# Patient Record
Sex: Male | Born: 1980 | Race: White | Hispanic: No | Marital: Married | Smoking: Never smoker
Health system: Southern US, Community
[De-identification: ages and names within clinical notes are randomized; demographics above are authoritative.]

## PROBLEM LIST (undated history)

## (undated) DIAGNOSIS — B019 Varicella without complication: Secondary | ICD-10-CM

## (undated) DIAGNOSIS — D179 Benign lipomatous neoplasm, unspecified: Secondary | ICD-10-CM

## (undated) DIAGNOSIS — M7051 Other bursitis of knee, right knee: Secondary | ICD-10-CM

## (undated) DIAGNOSIS — E785 Hyperlipidemia, unspecified: Secondary | ICD-10-CM

## (undated) HISTORY — DX: Varicella without complication: B01.9

## (undated) HISTORY — DX: Benign lipomatous neoplasm, unspecified: D17.9

## (undated) HISTORY — DX: Hyperlipidemia, unspecified: E78.5

## (undated) HISTORY — DX: Other bursitis of knee, right knee: M70.51

---
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
## 1998-04-04 ENCOUNTER — Inpatient Hospital Stay: Admission: RE | Admit: 1998-04-04 | Discharge: 1998-04-09 | Payer: Self-pay | Admitting: Cardiothoracic Surgery

## 1998-04-04 ENCOUNTER — Encounter: Payer: Self-pay | Admitting: Cardiothoracic Surgery

## 1998-04-05 ENCOUNTER — Encounter: Payer: Self-pay | Admitting: Cardiothoracic Surgery

## 1998-04-06 ENCOUNTER — Encounter: Payer: Self-pay | Admitting: Cardiothoracic Surgery

## 1998-04-07 ENCOUNTER — Encounter: Payer: Self-pay | Admitting: Cardiothoracic Surgery

## 2008-03-23 ENCOUNTER — Encounter: Admission: RE | Admit: 2008-03-23 | Discharge: 2008-03-23 | Payer: Self-pay | Admitting: Family Medicine

## 2009-10-31 IMAGING — US US EXTREM LOW VENOUS*R*
1 series · 14 of 24 positions shown · non-contrast
Comparison: None

CLINICAL DATA: Right calf pain



[Series 1: us extrem low venous*right* · 14 of 26 slices shown]
[im 1/26]
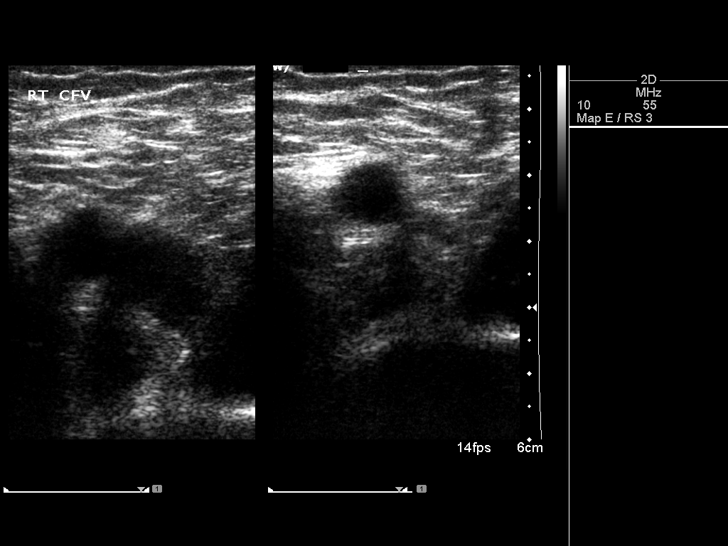
[im 3/26]
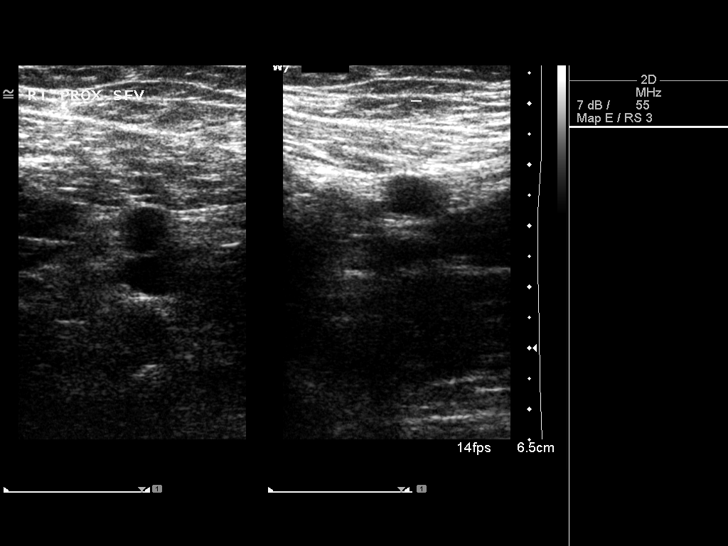
[im 5/26]
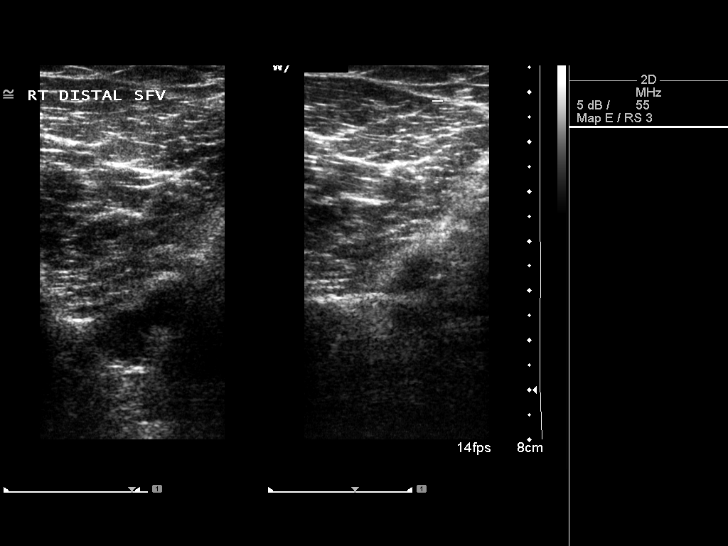
[im 7/26]
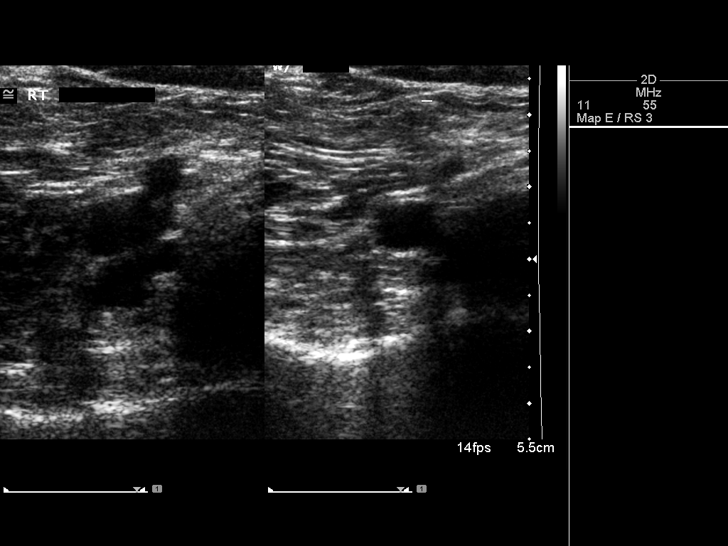
[im 8/26]
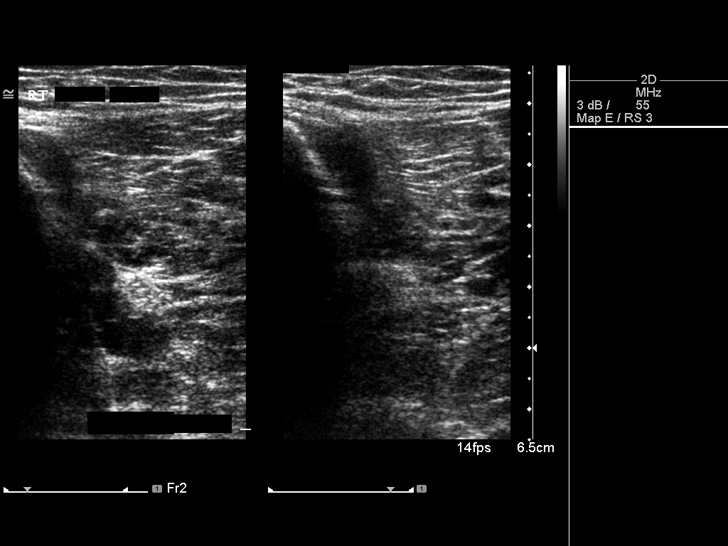
[im 10/26]
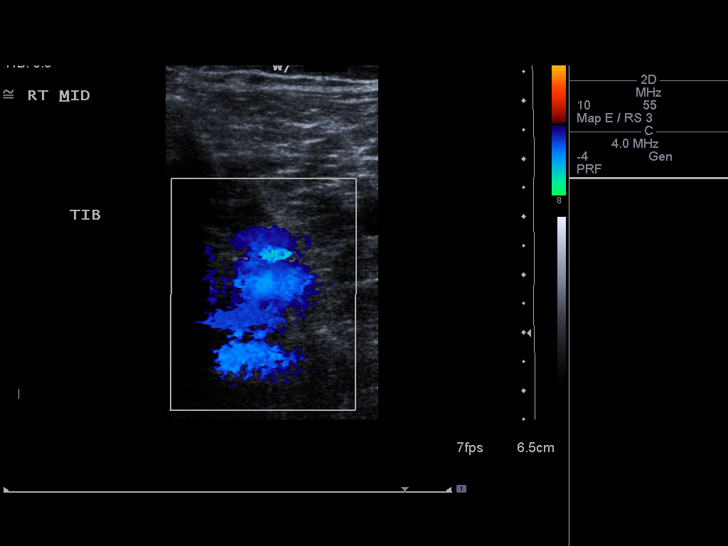
[im 12/26]
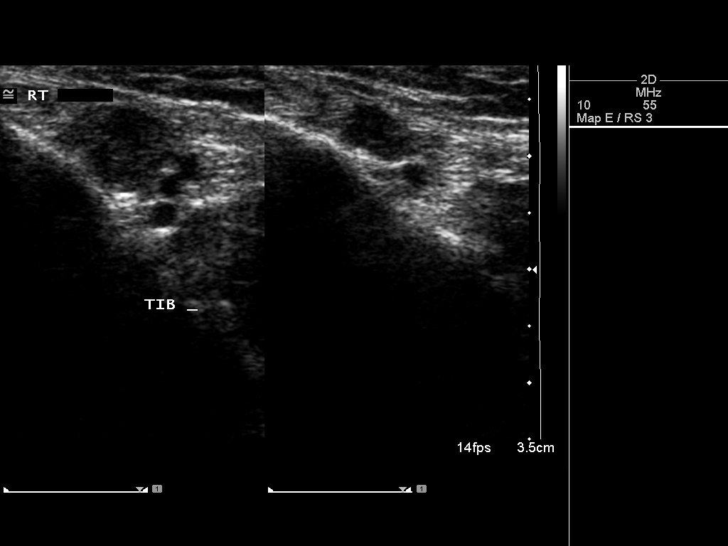
[im 14/26]
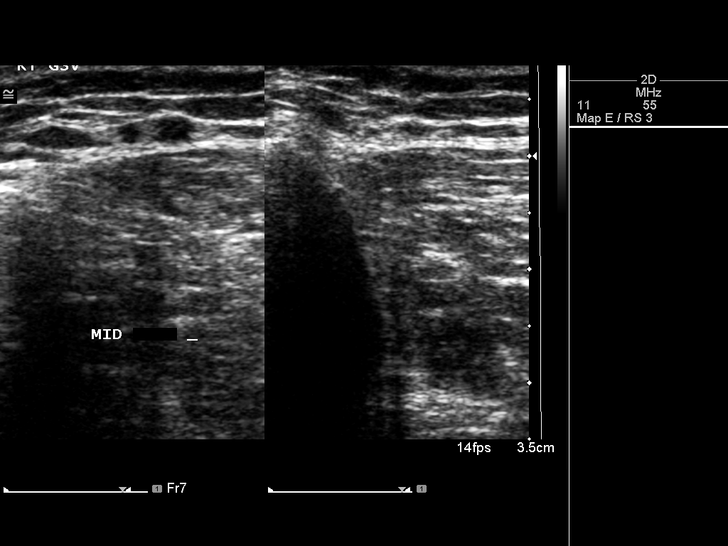
[im 16/26]
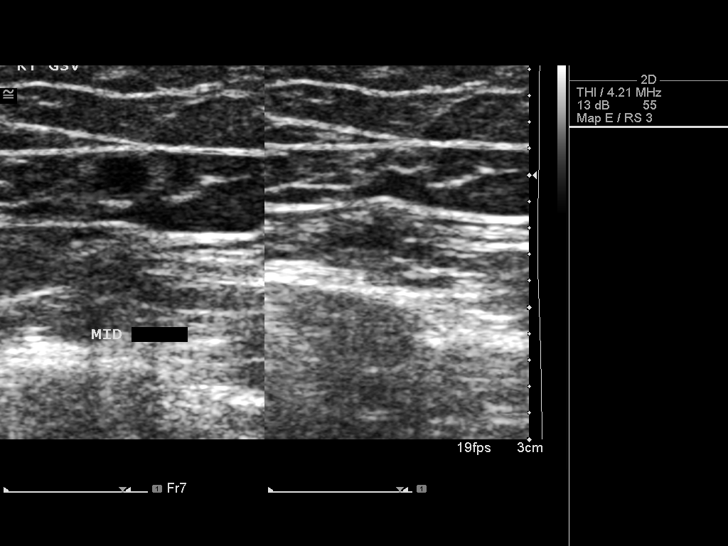
[im 18/26]
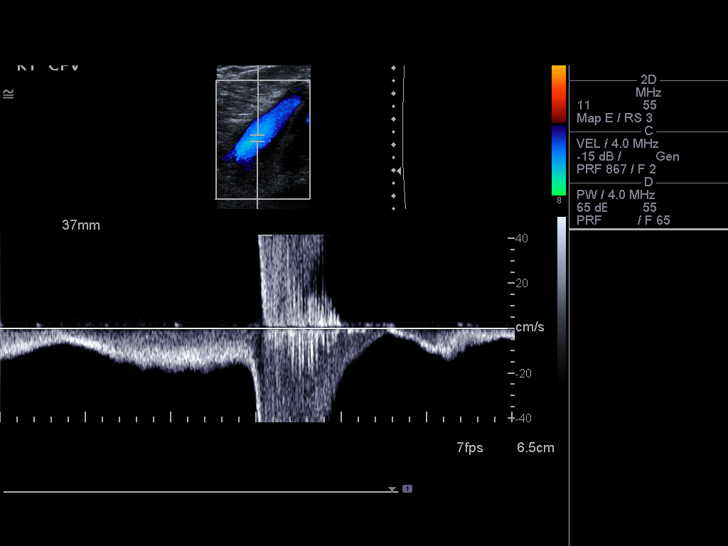
[im 20/26]
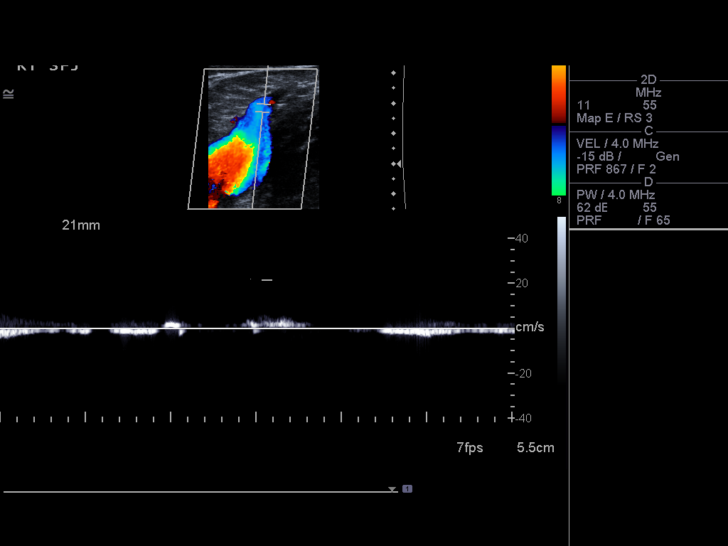
[im 21/26]
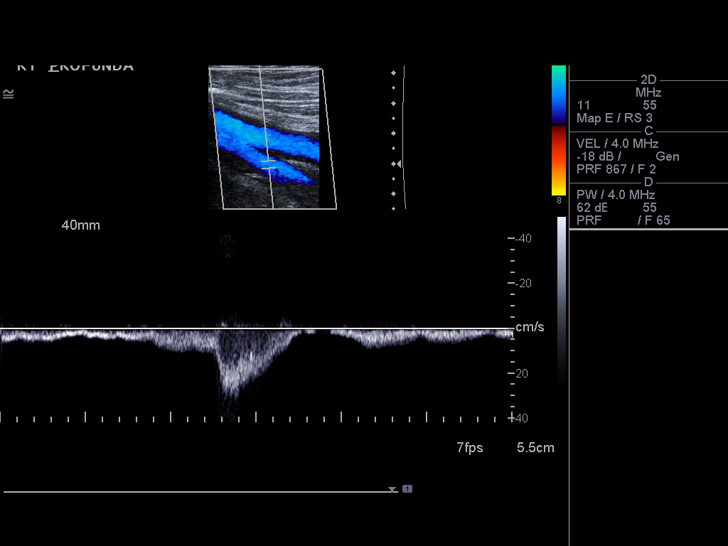
[im 23/26]
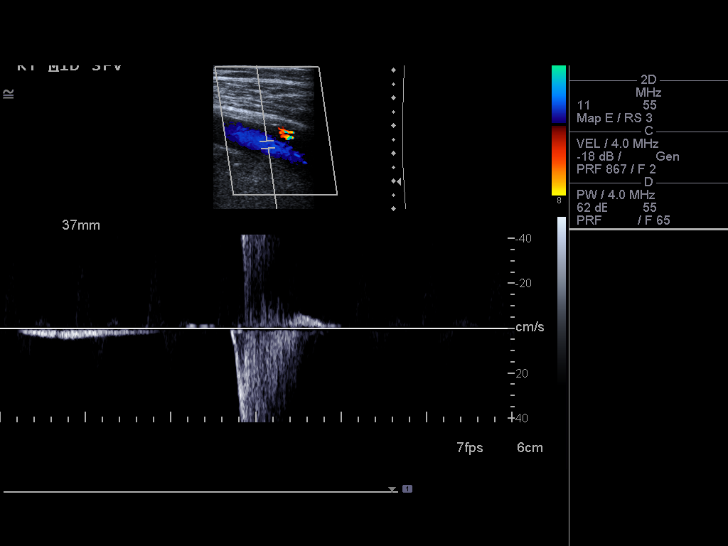
[im 26/26]
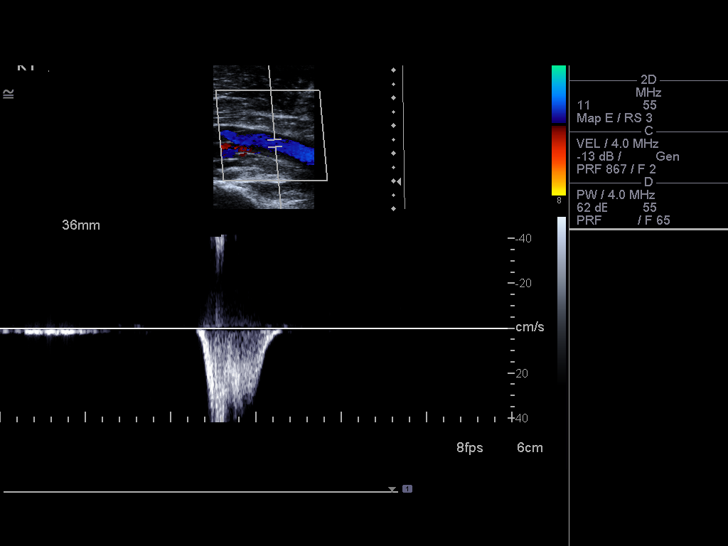

[14 of 24 positions shown; findings below may reference images not displayed]

FINDINGS: Normal compressibility of  the right common femoral,
superficial femoral, and popliteal veins is demonstrated, as well
as the visualized proximal calf veins.  No filling defects to
suggest DVT on grayscale or color Doppler imaging.  Doppler
waveforms show normal direction of venous flow, normal respiratory
phasicity and response to augmentation.
IMPRESSION: No evidence of  right lower extremity deep vein thrombosis.

## 2012-03-10 ENCOUNTER — Ambulatory Visit
Admission: RE | Admit: 2012-03-10 | Discharge: 2012-03-10 | Disposition: A | Discharge status: Home / Self Care | Payer: No Typology Code available for payment source | Source: Ambulatory Visit | Attending: Occupational Medicine | Admitting: Occupational Medicine

## 2012-03-10 ENCOUNTER — Other Ambulatory Visit: Payer: Self-pay | Admitting: Occupational Medicine

## 2012-03-10 DIAGNOSIS — Z021 Encounter for pre-employment examination: Secondary | ICD-10-CM

## 2012-08-10 ENCOUNTER — Ambulatory Visit (INDEPENDENT_AMBULATORY_CARE_PROVIDER_SITE_OTHER): Payer: 59 | Admitting: Surgery

## 2012-08-10 ENCOUNTER — Encounter (INDEPENDENT_AMBULATORY_CARE_PROVIDER_SITE_OTHER): Payer: Self-pay | Admitting: Surgery

## 2012-08-10 VITALS — BP 122/78 | HR 72 | Temp 98.2°F | Resp 16 | Ht 74.0 in | Wt 219.8 lb

## 2012-08-10 DIAGNOSIS — R222 Localized swelling, mass and lump, trunk: Secondary | ICD-10-CM

## 2012-08-10 DIAGNOSIS — R229 Localized swelling, mass and lump, unspecified: Secondary | ICD-10-CM

## 2012-08-10 NOTE — Progress Notes (Signed)
Subjective:     Patient ID: Brendan Brady, male   DOB: [birth year], 32 y.o.   MRN: [id number]  HPI This is a pleasant gentleman who is a self-referral for evaluation of a mass on his left upper back. He noticed it about a month ago. He is in Geographical information systems officer and is having discomfort at the area of the mass. He is otherwise without complaints.  Review of Systems He denies chest pain or shortness of breath. He has no neurologic symptoms and no pain down his arm    Objective:   Physical Exam He is awake and alert and normal in appearance in no acute distress Lungs clear to auscultation bilaterally with normal respiratory rate Cardiovascular is regular rate and rhythm with no murmurs and no peripheral edema There is a 2 cm mass just to the left of the midline of the upper back at the base of the neck. There are no skin changes. 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 Because of a previous history of a thoracic mass and the fact this is symptomatic, removal is recommended. He will schedule this to be done in the procedure room in our office at his convenience.

## 2014-04-12 ENCOUNTER — Other Ambulatory Visit (INDEPENDENT_AMBULATORY_CARE_PROVIDER_SITE_OTHER): Payer: Self-pay | Admitting: Surgery

## 2014-05-18 ENCOUNTER — Ambulatory Visit (INDEPENDENT_AMBULATORY_CARE_PROVIDER_SITE_OTHER): Payer: 59 | Admitting: Family Medicine

## 2014-05-18 ENCOUNTER — Encounter: Payer: Self-pay | Admitting: Family Medicine

## 2014-05-18 VITALS — BP 130/80 | HR 90 | Temp 98.0°F | Ht 74.0 in | Wt 240.0 lb

## 2014-05-18 DIAGNOSIS — Z23 Encounter for immunization: Secondary | ICD-10-CM

## 2014-05-18 DIAGNOSIS — N50812 Left testicular pain: Secondary | ICD-10-CM

## 2014-05-18 DIAGNOSIS — N508 Other specified disorders of male genital organs: Secondary | ICD-10-CM

## 2014-05-18 NOTE — Progress Notes (Signed)
   Subjective:    Patient ID: Brendan Brady, male    DOB: 1980-11-19, 33 y.o.   MRN: [id number]  HPI  Patient here to establish care. Generally very healthy. He had lipoma excised from his back recently by surgeon that is healed well. No medications. No surgeries otherwise. He is married and has 2 children. One-year-old with cystic fibrosis. Patient has never smoked. No alcohol use. No history of illicit drug use.  He works as a Airline pilot. Family history is significant for mother with breast cancer.  Patient is seen with acute issue of onset 1 week ago of left testicle pain. No injury. No masses palpated. No urinary symptoms. He took some ibuprofen which helped slightly. Symptoms are actually somewhat better compared to last week and only mild pain at this time. No exacerbating features.  Past Medical History  Diagnosis Date  . Chicken pox   . Lipoma    Past Surgical History  Procedure Laterality Date  . Cyst excision  1998    reports that he has never smoked. He has never used smokeless tobacco. He reports that he drinks alcohol. He reports that he does not use illicit drugs. family history includes Arthritis in his father; Cancer in his mother. Allergies  Allergen Reactions  . Latex       Review of Systems  Constitutional: Negative for fever, chills and appetite change.  Genitourinary: Negative for dysuria and hematuria.  Hematological: Negative for adenopathy. Does not bruise/bleed easily.       Objective:   Physical Exam  Constitutional: He appears well-developed and well-nourished.  Cardiovascular: Normal rate.   Pulmonary/Chest: Effort normal and breath sounds normal. No respiratory distress. He has no wheezes. He has no rales.  Genitourinary:  Testes are normal. No epididymis pain. No hernia. No testicle mass.          Assessment & Plan:  Left testicular pain which is actually improving. Normal exam. ?noninfectious epididymitis vs other.  We recommend continue  ibuprofen and briefs for support. Touch base in 1-2 weeks if not fully resolved.

## 2014-05-18 NOTE — Patient Instructions (Signed)
Continue with Ibuprofen as needed Continue with briefs for support. Please let me know in 1-2 weeks if pain not continuing to improve.

## 2014-05-18 NOTE — Progress Notes (Signed)
Pre visit review using our clinic review tool, if applicable. No additional management support is needed unless otherwise documented below in the visit note. 

## 2014-06-27 ENCOUNTER — Encounter: Payer: Self-pay | Admitting: Family Medicine

## 2014-06-27 ENCOUNTER — Ambulatory Visit (INDEPENDENT_AMBULATORY_CARE_PROVIDER_SITE_OTHER): Payer: 59 | Admitting: Family Medicine

## 2014-06-27 VITALS — BP 120/70 | HR 88 | Temp 97.9°F | Wt 243.0 lb

## 2014-06-27 DIAGNOSIS — E781 Pure hyperglyceridemia: Secondary | ICD-10-CM

## 2014-06-27 DIAGNOSIS — R740 Nonspecific elevation of levels of transaminase and lactic acid dehydrogenase [LDH]: Secondary | ICD-10-CM

## 2014-06-27 DIAGNOSIS — R7401 Elevation of levels of liver transaminase levels: Secondary | ICD-10-CM

## 2014-06-27 NOTE — Progress Notes (Signed)
Pre visit review using our clinic review tool, if applicable. No additional management support is needed unless otherwise documented below in the visit note. 

## 2014-06-27 NOTE — Patient Instructions (Signed)

## 2014-06-27 NOTE — Progress Notes (Signed)
   Subjective:    Patient ID: Brendan Brady, male    DOB: October 15, 1980, 33 y.o.   MRN: [id number]  HPI Patient seen for evaluation regarding abnormal labs recently obtained through his work. He works as a Airline pilot. His labs were significant for mildly elevated liver transaminases with AST of 46 and ALT of 86. He also had triglycerides of 198 and low HDL 33. Blood sugar was normal. Alkaline phosphatase normal. No alcohol use. No history of blood transfusions. No prior history of known elevated liver transaminases. He has gained substantial weight over the past couple of years. He stays physically active but frequently knows that he overeats.  Denies any appetite or weight changes. No abdominal pain. No history of polyuria or polydipsia. No history of hepatitis. He is monogamous. No history of IV drug use. Takes no regular medications. He did take some hydrocodone recent weeks after having lipoma excised per surgeon. He takes occasional ibuprofen but no other medications  Past Medical History  Diagnosis Date  . Chicken pox   . Lipoma    Past Surgical History  Procedure Laterality Date  . Cyst excision  1998    reports that he has never smoked. He has never used smokeless tobacco. He reports that he drinks alcohol. He reports that he does not use illicit drugs. family history includes Arthritis in his father; Cancer in his mother. Allergies  Allergen Reactions  . Latex       Review of Systems  Constitutional: Negative for fever, chills, appetite change and unexpected weight change.  Respiratory: Negative for shortness of breath.   Gastrointestinal: Negative for nausea, vomiting, abdominal pain, diarrhea and constipation.  Hematological: Negative for adenopathy.       Objective:   Physical Exam  Constitutional: He appears well-developed and well-nourished.  Cardiovascular: Normal rate and regular rhythm.   Pulmonary/Chest: Effort normal and breath sounds normal. No respiratory  distress. He has no wheezes. He has no rales.  Abdominal: Soft. Bowel sounds are normal. He exhibits no distension and no mass. There is no tenderness. There is no rebound and no guarding.          Assessment & Plan:  #1 elevated liver transaminases. We explained these are very nonspecific. He is basically asymptomatic. He's had substantial weight gain past year and suspect fatty liver changes are most likely culprit. Since he is asymptomatic we gave option of losing some weight and recheck liver transaminases in about 2 months. Consider further evaluation then if either elevating or not coming down #2 elevated triglycerides. He also has low HDL. At risk for metabolic syndrome. Handout given on lowering triglycerides. We've recommended lifestyle modification as a first step. Consider repeat fasting lipids within the next year.

## 2014-07-05 ENCOUNTER — Encounter: Payer: Self-pay | Admitting: Occupational Medicine

## 2014-08-23 ENCOUNTER — Other Ambulatory Visit: Payer: 59

## 2014-09-21 ENCOUNTER — Other Ambulatory Visit: Payer: Self-pay

## 2015-06-19 ENCOUNTER — Ambulatory Visit (INDEPENDENT_AMBULATORY_CARE_PROVIDER_SITE_OTHER): Payer: 59 | Admitting: Family Medicine

## 2015-06-19 ENCOUNTER — Encounter: Payer: Self-pay | Admitting: Family Medicine

## 2015-06-19 VITALS — BP 120/74 | HR 97 | Temp 98.6°F | Resp 16 | Ht 74.0 in | Wt 229.1 lb

## 2015-06-19 DIAGNOSIS — B349 Viral infection, unspecified: Secondary | ICD-10-CM | POA: Diagnosis not present

## 2015-06-19 NOTE — Progress Notes (Signed)
   Subjective:    Patient ID: Brendan Brady, male    DOB: 11/29/1980, 34 y.o.   MRN: [id number]  HPI Acute visit Onset last night of chills, possible low-grade fever, cough, nasal congestion. Went to bed early and had significant night sweats andwoke up this morning feeling much better. Denies any sore throat or rash. Congestion is moderate. Cough is relatively mild. No dyspnea. Denies any nausea, vomiting, or diarrhea.  Past Medical History  Diagnosis Date  . Chicken pox   . Lipoma    Past Surgical History  Procedure Laterality Date  . Cyst excision  1998    reports that he has never smoked. He has never used smokeless tobacco. He reports that he drinks alcohol. He reports that he does not use illicit drugs. family history includes Arthritis in his father; Cancer in his mother. Allergies  Allergen Reactions  . Latex       Review of Systems  Constitutional: Positive for fever, chills and fatigue.  HENT: Positive for congestion.   Respiratory: Positive for cough.   Gastrointestinal: Negative for nausea, vomiting and abdominal pain.  Genitourinary: Negative for dysuria.  Skin: Negative for rash.       Objective:   Physical Exam  Constitutional: He appears well-developed and well-nourished. No distress.  HENT:  Right Ear: External ear normal.  Left Ear: External ear normal.  Mouth/Throat: Oropharynx is clear and moist.  Neck: Neck supple.  Cardiovascular: Normal rate and regular rhythm.   Pulmonary/Chest: Effort normal and breath sounds normal. No respiratory distress. He has no wheezes. He has no rales.  Lymphadenopathy:    He has no cervical adenopathy.  Skin: No rash noted.          Assessment & Plan:  Viral syndrome. Patient already improved symptomatically. Increase hydration. Treat fever and myalgias symptomatically with Advil or Aleve. Follow-up as needed

## 2015-06-19 NOTE — Patient Instructions (Signed)
Viral Infections °A viral infection can be caused by different types of viruses. Most viral infections are not serious and resolve on their own. However, some infections may cause severe symptoms and may lead to further complications. °SYMPTOMS °Viruses can frequently cause: °· Minor sore throat. °· Aches and pains. °· Headaches. °· Runny nose. °· Different types of rashes. °· Watery eyes. °· Tiredness. °· Cough. °· Loss of appetite. °· Gastrointestinal infections, resulting in nausea, vomiting, and diarrhea. °These symptoms do not respond to antibiotics because the infection is not caused by bacteria. However, you might catch a bacterial infection following the viral infection. This is sometimes called a "superinfection." Symptoms of such a bacterial infection may include: °· Worsening sore throat with pus and difficulty swallowing. °· Swollen neck glands. °· Chills and a high or persistent fever. °· Severe headache. °· Tenderness over the sinuses. °· Persistent overall ill feeling (malaise), muscle aches, and tiredness (fatigue). °· Persistent cough. °· Yellow, green, or brown mucus production with coughing. °HOME CARE INSTRUCTIONS  °· Only take over-the-counter or prescription medicines for pain, discomfort, diarrhea, or fever as directed by your caregiver. °· Drink enough water and fluids to keep your urine clear or pale yellow. Sports drinks can provide valuable electrolytes, sugars, and hydration. °· Get plenty of rest and maintain proper nutrition. Soups and broths with crackers or rice are fine. °SEEK IMMEDIATE MEDICAL CARE IF:  °· You have severe headaches, shortness of breath, chest pain, neck pain, or an unusual rash. °· You have uncontrolled vomiting, diarrhea, or you are unable to keep down fluids. °· You or your child has an oral temperature above 102° F (38.9° C), not controlled by medicine. °· Your baby is older than 3 months with a rectal temperature of 102° F (38.9° C) or higher. °· Your baby is 3  months old or younger with a rectal temperature of 100.4° F (38° C) or higher. °MAKE SURE YOU:  °· Understand these instructions. °· Will watch your condition. °· Will get help right away if you are not doing well or get worse. °  °This information is not intended to replace advice given to you by your health care provider. Make sure you discuss any questions you have with your health care provider. °  °Document Released: 04/17/2005 Document Revised: 09/30/2011 Document Reviewed: 12/14/2014 °Elsevier Interactive Patient Education ©2016 [employer] ° °

## 2015-06-19 NOTE — Progress Notes (Signed)
Pre visit review using our clinic review tool, if applicable. No additional management support is needed unless otherwise documented below in the visit note. 

## 2015-06-20 ENCOUNTER — Telehealth: Payer: Self-pay | Admitting: Family Medicine

## 2015-06-20 NOTE — Telephone Encounter (Signed)
Elmyra Ricks, Mr. Fosters wife called saying Dr. B told her if he got worse to call back and he's gotten worse. She said he's currently on the couch shivering and they're not sure what they need to do. Please give them a phone call regarding this.  Nicole's ph# [phone] Thank you.

## 2015-06-20 NOTE — Telephone Encounter (Signed)
[facility] [place] [facility]  Patient Name: Brendan Brady  DOB: [birth year]    Initial Comment caller states her husband was in the ofc yesterday - was told to call today if he is not better - has been shivering for 5 hrs - has temp of only 99.6 and has nausea and vomiting   Nurse Assessment  Nurse: Wynetta Emery, RN, Baker Janus Date/Time [name] Time): 06/20/2015 5:23:09 PM  Confirm and document reason for call. If symptomatic, describe symptoms. ---[name] was seen yesterday in office -- extreme fatigue achy fever but not registering -- shivering -- advised by MD to call back if things changed or got worse; attempted to go to work today and came home early and crashed on the couch -99.6 low grade shivering still since 12n today and fatigued achy and now vomiting  Has the patient traveled out of the country within the last 30 days? ---No  Does the patient have any new or worsening symptoms? ---Yes  Will a triage be completed? ---Yes  Related visit to physician within the last 2 weeks? ---No  Does the PT have any chronic conditions? (i.e. diabetes, asthma, etc.) ---No  Is this a behavioral health call? ---No     Guidelines    Guideline Title Affirmed Question Affirmed Notes  Headache [1] SEVERE headache AND [2] fever    Final Disposition User   Go to ED Now (or PCP triage) Wynetta Emery, RN, Baker Janus    Comments  Dr. Nicki Reaper called back gave her information; she tried to call them and was unable to reach Her. called on both numbers Please advise she did attempt to call them if they call back. Nurse called back and Elmyra Ricks had been speaking with Dr. Elease Hashimoto and given Medical Directives of what to do Thanked Nurse for calling back and advising that Dr. Nicki Reaper did call or attempt to call.   Referrals  REFERRED TO PCP OFFICE  REFERRED TO PCP OFFICE   Disagree/Comply: Comply

## 2015-06-20 NOTE — Telephone Encounter (Signed)
Please review

## 2015-06-21 NOTE — Telephone Encounter (Signed)
No nausea or fever

## 2015-06-21 NOTE — Telephone Encounter (Signed)
Spoke with wife this AM, there is some improvement in Symptoms. He was able to keep down his breakfast and fluids. Last PM was the last time he vomited. His fever was 103 last night but today is 97.1. I recommended for pt to rest and eat a bland diet today. He will try to increase fluids. If fever returns or sx worsen again, she will notify the office. Any other recommendations?

## 2015-06-21 NOTE — Telephone Encounter (Signed)
I spoke with his wife last night.  He had no fever at that time but did have chills and had one episode of vomiting yesterday.  Denied abdominal pain, dysuria, confusion, Advised follow up for any persistent vomiting or persistent or worsening symptoms.  He is keeping fluids down.   I would call again this morning to see how he is doing.

## 2015-06-21 NOTE — Telephone Encounter (Signed)
I think that sounds good.

## 2015-06-21 NOTE — Telephone Encounter (Signed)
Spoke to pt and he said he doing a little better but still feeling sick. Looking forward to see if Dr Elease Hashimoto send any medication to the pharmacy

## 2015-06-21 NOTE — Telephone Encounter (Signed)
Is he having any nausea? Or fever?

## 2015-06-22 ENCOUNTER — Ambulatory Visit (INDEPENDENT_AMBULATORY_CARE_PROVIDER_SITE_OTHER): Payer: 59 | Admitting: Family Medicine

## 2015-06-22 ENCOUNTER — Encounter: Payer: Self-pay | Admitting: *Deleted

## 2015-06-22 ENCOUNTER — Encounter: Payer: Self-pay | Admitting: Family Medicine

## 2015-06-22 VITALS — BP 118/80 | HR 79 | Temp 98.2°F | Ht 74.0 in | Wt 227.1 lb

## 2015-06-22 DIAGNOSIS — J0100 Acute maxillary sinusitis, unspecified: Secondary | ICD-10-CM | POA: Diagnosis not present

## 2015-06-22 DIAGNOSIS — J069 Acute upper respiratory infection, unspecified: Secondary | ICD-10-CM | POA: Diagnosis not present

## 2015-06-22 MED ORDER — AMOXICILLIN-POT CLAVULANATE 875-125 MG PO TABS: 1.0000 | ORAL_TABLET | Freq: Two times a day (BID) | ORAL | Status: DC

## 2015-06-22 NOTE — Progress Notes (Signed)
Pre visit review using our clinic review tool, if applicable. No additional management support is needed unless otherwise documented below in the visit note. 

## 2015-06-22 NOTE — Patient Instructions (Signed)

## 2015-06-22 NOTE — Progress Notes (Signed)
   HPI:  URI: -started:4 days ago - saw PCP - supportive care advised -symptoms:nasal congestion, sore throat, cough, fever/chills/nausea/body aches initially - now resolved - now reports feels much better over all, but with thick nasal congestion, L max sinus pain and pressure, mild upper tooth pain on L  -denies: persistent fever, SOB, hemoptysis, rash -has tried: essential oils - doterra -sick contacts/travel/risks: denies flu exposure, tick exposure or or Ebola risks -Hx of: sinusitis  ROS: See pertinent positives and negatives per HPI.  Past Medical History  Diagnosis Date  . Chicken pox   . Lipoma     Past Surgical History  Procedure Laterality Date  . Cyst excision  1998    Family History  Problem Relation Age of Onset  . Cancer Mother     Breast  . Arthritis Father     Social History   Social History  . Marital Status: Married    Spouse Name: N/A  . Number of Children: N/A  . Years of Education: N/A   Social History Main Topics  . Smoking status: Never Smoker   . Smokeless tobacco: Never Used  . Alcohol Use: Yes     Comment: Social  . Drug Use: No  . Sexual Activity: Not Asked   Other Topics Concern  . None   Social History Narrative     Current outpatient prescriptions:  .  amoxicillin-clavulanate (AUGMENTIN) 875-125 MG tablet, Take 1 tablet by mouth 2 (two) times daily. Please shred rx if you do not use., Disp: 14 tablet, Rfl: 0  EXAM:  Filed Vitals:   06/22/15 1250  BP: 118/80  Pulse: 79  Temp: 98.2 F (36.8 C)    Body mass index is 29.15 kg/(m^2).  GENERAL: vitals reviewed and listed above, alert, oriented, appears well hydrated and in no acute distress  HEENT: atraumatic, conjunttiva clear, no obvious abnormalities on inspection of external nose and ears, normal appearance of ear canals and TMs, thick nasal congestion, mild post oropharyngeal erythema with PND, no tonsillar edema or exudate, no sinus TTP  NECK: no obvious masses on  inspection  LUNGS: clear to auscultation bilaterally, no wheezes, rales or rhonchi, good air movement  CV: HRRR, no peripheral edema  MS: moves all extremities without noticeable abnormality  PSYCH: pleasant and cooperative, no obvious depression or anxiety  ASSESSMENT AND PLAN:  Discussed the following assessment and plan:  Acute upper respiratory infection  Acute maxillary sinusitis, recurrence not specified  -given HPI and exam findings today, a serious infection or illness is unlikely. We discussed potential etiologies, with VURI being most likely given overal improvement, and advised supportive care and monitoring. We did opt to provide a written abx rx incase sinus pain does not resolve over the next few days or worsens. We discussed treatment side effects, likely course, antibiotic misuse, transmission, and signs of developing a serious illness. -of course, we advised to return or notify a doctor immediately if symptoms worsen or persist or new concerns arise.    There are no Patient Instructions on file for this visit.   Colin Benton R.

## 2015-10-18 ENCOUNTER — Ambulatory Visit (INDEPENDENT_AMBULATORY_CARE_PROVIDER_SITE_OTHER): Payer: 59 | Admitting: Family Medicine

## 2015-10-18 ENCOUNTER — Encounter: Payer: Self-pay | Admitting: Family Medicine

## 2015-10-18 VITALS — BP 120/82 | HR 71 | Temp 98.6°F | Wt 229.0 lb

## 2015-10-18 DIAGNOSIS — R7989 Other specified abnormal findings of blood chemistry: Secondary | ICD-10-CM

## 2015-10-18 DIAGNOSIS — R1031 Right lower quadrant pain: Secondary | ICD-10-CM | POA: Diagnosis not present

## 2015-10-18 DIAGNOSIS — R945 Abnormal results of liver function studies: Secondary | ICD-10-CM

## 2015-10-18 LAB — CBC WITH DIFFERENTIAL/PLATELET
BASOS PCT: 0.5 % (ref 0.0–3.0)
Basophils Absolute: 0 10*3/uL (ref 0.0–0.1)
EOS ABS: 0.2 10*3/uL (ref 0.0–0.7)
EOS PCT: 3.1 % (ref 0.0–5.0)
HCT: 44.2 % (ref 39.0–52.0)
Hemoglobin: 15.2 g/dL (ref 13.0–17.0)
LYMPHS ABS: 1.9 10*3/uL (ref 0.7–4.0)
Lymphocytes Relative: 33.9 % (ref 12.0–46.0)
MCHC: 34.3 g/dL (ref 30.0–36.0)
MCV: 93.3 fl (ref 78.0–100.0)
MONO ABS: 0.7 10*3/uL (ref 0.1–1.0)
Monocytes Relative: 12.7 % — ABNORMAL HIGH (ref 3.0–12.0)
NEUTROS PCT: 49.8 % (ref 43.0–77.0)
Neutro Abs: 2.8 10*3/uL (ref 1.4–7.7)
Platelets: 225 10*3/uL (ref 150.0–400.0)
RBC: 4.74 Mil/uL (ref 4.22–5.81)
RDW: 11.9 % (ref 11.5–15.5)
WBC: 5.7 10*3/uL (ref 4.0–10.5)

## 2015-10-18 LAB — COMPREHENSIVE METABOLIC PANEL
ALT: 65 U/L — ABNORMAL HIGH (ref 0–53)
AST: 36 U/L (ref 0–37)
Albumin: 4.8 g/dL (ref 3.5–5.2)
Alkaline Phosphatase: 70 U/L (ref 39–117)
BUN: 16 mg/dL (ref 6–23)
CHLORIDE: 102 meq/L (ref 96–112)
CO2: 31 meq/L (ref 19–32)
Calcium: 10 mg/dL (ref 8.4–10.5)
Creatinine, Ser: 0.89 mg/dL (ref 0.40–1.50)
GFR: 102.1 mL/min (ref >60.00)
GLUCOSE: 92 mg/dL (ref 70–99)
POTASSIUM: 4.1 meq/L (ref 3.5–5.1)
SODIUM: 139 meq/L (ref 135–145)
Total Bilirubin: 1.7 mg/dL — ABNORMAL HIGH (ref 0.2–1.2)
Total Protein: 7.1 g/dL (ref 6.0–8.3)

## 2015-10-18 NOTE — Patient Instructions (Signed)
Labs before you leave. I highly doubt serious infection but would return to care if recurrence or worsening of pain, fever, nausea or vomiting.   Check liver function tests as part of blood work. This could be due to "fatty liver" so weight loss may help. Glad you are exercising but may want to work on improving diet to help trim a few lbs.   Encourage you to schedule next available physical with Dr. Elease Hashimoto to follow up as no recent physical

## 2015-10-18 NOTE — Progress Notes (Signed)
Garret Reddish, MD  Subjective:  Brendan Brady is a 35 y.o. year old very pleasant male patient who presents for/with See problem oriented charting ROS- see ros below  Past Medical History-  Patient Active Problem List   Diagnosis Date Noted  . Mass on back 08/10/2012    Medications- reviewed and updated, none except for probiotic dose last night  Objective: BP 120/82 mmHg  Pulse 71  Temp(Src) 98.6 F (37 C)  Wt 229 lb (103.874 kg) Gen: NAD, resting comfortably, well appearing CV: RRR no murmurs rubs or gallops Lungs: CTAB no crackles, wheeze, rhonchi Abdomen: soft/nontender/nondistended/normal bowel sounds. No rebound or guarding. Some stretch marks from prior weight loss.  Ext: no edema Skin: warm, dry Neuro: grossly normal, moves all extremities  Assessment/Plan:  RLQ abdominal pain S: 4-5 days ago was on his sotmach and felt pain in RLQ felt sharp at first (was wrestling with his sons). Got up and was still tender to touch. Almost feels like a bruise in area. Regular bowel movements and eating normally. Wife had had appendix out in past so he got worried about that. He took a probiotic last night and has actually gotten better since then. Self proclaimed "wimp" and states he just wanted to get checked out before vacation. Peak pain was 5/10, at present does not even feel it. Took ibuprofen- several doses and that helped minimally. Also passed a good amount of gas yesterday and today which seemed to help as well ROS- no nausea, vomiting, fever, dysuria, polyuria (other than drinking more water recently). No diarrhea or constipation A/P: benign exam and pain has now gone. I think likelihood of serious infection is incredibly low. Patient about to leave for beach and would like to have some more peace of mind- we will get a CBC just to make sure WBC not elevated (needs repeat LFTs anyway). I suspect this was a muscle strain vs. Gas/bloating but now resolved. Did not get urine with no  other potential UTI symptoms.   Elevated LFTs - Plan: Comprehensive metabolic panel S: mild elevations since 2015 06/08/14 AST 46, alt 86 08/29/15 ast 45, alt 86. Advised 4-6 week repeat  Has been losing weight but no improvement A/P: will make sure no further elevation- if not above 100, would refer back to PCP for further evaluation at physical (next available). Suspect fatty liver as cause. He is exercising but encouraged weight loss  Return precautions advised.   Orders Placed This Encounter  Procedures  . CBC with Differential/Platelet  . Comprehensive metabolic panel    [place]

## 2016-07-22 HISTORY — PX: VASECTOMY: SHX75

## 2016-08-06 DIAGNOSIS — J0410 Acute tracheitis without obstruction: Secondary | ICD-10-CM | POA: Diagnosis not present

## 2016-08-06 DIAGNOSIS — R05 Cough: Secondary | ICD-10-CM | POA: Diagnosis not present

## 2016-08-29 DIAGNOSIS — M7041 Prepatellar bursitis, right knee: Secondary | ICD-10-CM | POA: Diagnosis not present

## 2016-08-30 DIAGNOSIS — Z23 Encounter for immunization: Secondary | ICD-10-CM | POA: Diagnosis not present

## 2016-09-02 ENCOUNTER — Encounter (HOSPITAL_COMMUNITY): Payer: Self-pay

## 2016-09-02 DIAGNOSIS — M7989 Other specified soft tissue disorders: Secondary | ICD-10-CM | POA: Diagnosis not present

## 2016-09-02 DIAGNOSIS — Z91040 Latex allergy status: Secondary | ICD-10-CM | POA: Diagnosis not present

## 2016-09-02 NOTE — ED Triage Notes (Signed)
Pt states bursitis of R knee. Pt states on steroids and abx. Pt states increased pain in R lower leg. Pt states seen by UC today, told to come here for ultrasound to rule out DVT. Pt with some mild edema at triage. Marked R sided swelling.

## 2016-09-03 ENCOUNTER — Emergency Department (HOSPITAL_COMMUNITY)
Admission: EM | Admit: 2016-09-03 | Discharge: 2016-09-03 | Disposition: A | Discharge status: Home / Self Care | Payer: 59 | Attending: Emergency Medicine | Admitting: Emergency Medicine

## 2016-09-03 ENCOUNTER — Ambulatory Visit (HOSPITAL_BASED_OUTPATIENT_CLINIC_OR_DEPARTMENT_OTHER)
Admission: RE | Admit: 2016-09-03 | Discharge: 2016-09-03 | Disposition: A | Discharge status: Home / Self Care | Payer: 59 | Source: Ambulatory Visit | Attending: Emergency Medicine | Admitting: Emergency Medicine

## 2016-09-03 DIAGNOSIS — M7041 Prepatellar bursitis, right knee: Secondary | ICD-10-CM | POA: Diagnosis not present

## 2016-09-03 DIAGNOSIS — M7989 Other specified soft tissue disorders: Secondary | ICD-10-CM | POA: Diagnosis not present

## 2016-09-03 MED ORDER — ENOXAPARIN SODIUM 100 MG/ML ~~LOC~~ SOLN
1.0000 mg/kg | Freq: Once | SUBCUTANEOUS | Status: AC
  Administered 2016-09-03: 100 mg via SUBCUTANEOUS
  Filled 2016-09-03: qty 1

## 2016-09-03 NOTE — ED Provider Notes (Signed)
[name] Provider Note   CSN: [id number] Arrival date & time: 09/02/16  1944     History   Chief Complaint Chief Complaint  Patient presents with  . Leg Pain  . Leg Swelling    HPI Brendan Brady is a 36 y.o. male.  Patient presents with complaint of right lower leg swelling x 1 day. He has been treated with Keflex for a right prepatellar bursitis x 2 days and he reports the redness and swelling associated with this is significantly improved. The lower leg swelling has been red on and off today. No significant discomfort. No fever. No pain or swelling above the knee. No fever. He was seen by his primary care physician today and sent to the emergency department for doppler studies to rule out DVT.    The history is provided by the patient. No language interpreter was used.  Leg Pain   Pertinent negatives include no numbness.    Past Medical History:  Diagnosis Date  . Chicken pox   . Lipoma     Patient Active Problem List   Diagnosis Date Noted  . Mass on back 08/10/2012    Past Surgical History:  Procedure Laterality Date  . CYST EXCISION  1998       Home Medications    Prior to Admission medications   Not on File    Family History Family History  Problem Relation Age of Onset  . Cancer Mother     Breast  . Arthritis Father     Social History Social History  Substance Use Topics  . Smoking status: Never Smoker  . Smokeless tobacco: Never Used  . Alcohol use Yes     Comment: Social     Allergies   Latex   Review of Systems Review of Systems  Constitutional: Negative for chills and fever.  Respiratory: Negative.  Negative for shortness of breath.   Cardiovascular: Negative.  Negative for chest pain.  Musculoskeletal:       See HPI.  Skin: Positive for color change.  Neurological: Negative.  Negative for weakness and numbness.     Physical Exam Updated Vital Signs BP 145/82   Pulse 83   Temp 98.3 F (36.8 C)   Resp 16    SpO2 99%   Physical Exam  Constitutional: He is oriented to person, place, and time. He appears well-developed and well-nourished.  Neck: Normal range of motion.  Pulmonary/Chest: Effort normal.  Musculoskeletal: Normal range of motion.  Right lower leg moderately swollen without discoloration or tenderness. Negative Homen's sign. No swelling of foot or ankle.   Neurological: He is alert and oriented to person, place, and time.  Skin: Skin is warm and dry.  Psychiatric: He has a normal mood and affect.     ED Treatments / Results  Labs (all labs ordered are listed, but only abnormal results are displayed) Labs Reviewed - No data to display  EKG  EKG Interpretation None       Radiology No results found.  Procedures Procedures (including critical care time)  Medications Ordered in ED Medications - No data to display   Initial Impression / Assessment and Plan / ED Course  I have reviewed the triage vital signs and the nursing notes.  Pertinent labs & imaging results that were available during my care of the patient were reviewed by me and considered in my medical decision making (see chart for details).     Patient with unilateral lower extremity swelling x  1 day. No redness or tenderness. Clinically low suspicion for DVT but will require doppler study in am. Lovenox provided tonight. He will follow up with PCP.  Final Clinical Impressions(s) / ED Diagnoses   Final diagnoses:  None   1. LE swelling  New Prescriptions New Prescriptions   No medications on file     Charlann Lange, PA-C 09/03/16 [employer], DO 09/03/16 0230

## 2016-09-03 NOTE — Progress Notes (Signed)
**  Preliminary report by tech**  Right lower extremity venous duplex complete. There is no evidence of deep or superficial vein thrombosis involving the right lower extremity. All visualized vessels appear patent and compressible. There is no evidence of a Baker's cyst on the right.  09/03/16 8:57 AM Carlos Levering RVT

## 2016-09-03 NOTE — Discharge Instructions (Signed)
RETURN IN THE MORNING FOR THE VENOUS DUPLEX STUDY TO EVALUATE THE RIGHT LEG FOR BLOOD CLOTS AS THE SOURCE OF YOUR SWELLING.

## 2016-09-09 DIAGNOSIS — M7051 Other bursitis of knee, right knee: Secondary | ICD-10-CM | POA: Diagnosis not present

## 2016-09-10 ENCOUNTER — Encounter: Payer: Self-pay | Admitting: Family Medicine

## 2016-09-10 ENCOUNTER — Ambulatory Visit (INDEPENDENT_AMBULATORY_CARE_PROVIDER_SITE_OTHER): Payer: 59 | Admitting: Family Medicine

## 2016-09-10 VITALS — BP 140/90 | HR 95 | Ht 74.0 in | Wt 232.0 lb

## 2016-09-10 DIAGNOSIS — R600 Localized edema: Secondary | ICD-10-CM

## 2016-09-10 DIAGNOSIS — M7041 Prepatellar bursitis, right knee: Secondary | ICD-10-CM | POA: Diagnosis not present

## 2016-09-10 NOTE — Progress Notes (Signed)
Pre visit review using our clinic review tool, if applicable. No additional management support is needed unless otherwise documented below in the visit note. 

## 2016-09-10 NOTE — Progress Notes (Signed)
Subjective:     Patient ID: Brendan Brady, male   DOB: [birth year], 35 y.o.   MRN: [id number]  HPI Patient's seen with recent right lower extremity edema. 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 He denies any specific injury. Onset couple weeks ago of some swelling of the prepatellar bursa region and subsequently leg. He went to orthopedist and was initially placed on prednisone then had some progressive redness and warmth of the prepatellar region was placed on Keflex. He went back with some dependent leg edema and also to the ER and received Lovenox and returned the next day and had venous Dopplers negative for DVT.  Denies any calf pain. Overall redness and swelling of the knee are improving. What urgent care again yesterday and reportedly given intramuscular steroids. No fevers or chills. Overall leg edema improving  Past Medical History:  Diagnosis Date  . Chicken pox   . Lipoma    Past Surgical History:  Procedure Laterality Date  . CYST EXCISION  1998    reports that he has never smoked. He has never used smokeless tobacco. He reports that he drinks alcohol. He reports that he does not use drugs. family history includes Arthritis in his father; Cancer in his mother. Allergies  Allergen Reactions  . Latex      Review of Systems  Constitutional: Negative for chills and fever.       Objective:   Physical Exam  Constitutional: He appears well-developed and well-nourished.  Cardiovascular: Normal rate and regular rhythm.   Pulmonary/Chest: Effort normal and breath sounds normal. No respiratory distress. He has no wheezes. He has no rales.  Musculoskeletal:  Knee reveals minimal prepatellar bursa swelling and slight warmth to touch. Nontender. No knee effusion. He has trace edema of the right leg but no calf tenderness       Assessment:     Right prepatellar bursa edema/inflammation with possible recent cellulitis improving.  He has mild leg edema which is very  likely "dependant" edema related to the bursitis.    Plan:     -Reassurance that his right leg edema is very likely related to dependency from the bursa swelling. -Observe for now. Follow-up promptly for any signs of recurrent cellulitis such as redness, increasing warmth, or pain  Brendan Post MD [facility]

## 2016-09-10 NOTE — Patient Instructions (Signed)
Prepatellar Bursitis Prepatellar bursitis is inflammation of the prepatellar bursa. The prepatellar bursa is a fluid-filled sac that cushions the kneecap (patella). Prepatellar bursitis happens when fluid builds up in the this sac and causes it to get larger. The condition causes knee pain. What are the causes? This condition may be caused by:  Constant pressure on the knees from kneeling.  A hit to the knee.  Falling on the knee.  A bacterial infection.  Moving the knee often in a forceful way. What increases the risk? This condition is more likely to develop in:  People who play a sport that involves a risk for falls on the knee or hard hits (blows) to the knee. These sports include:  Football.  Wrestling.  Basketball.  Soccer.  People who have to kneel for long periods of time, such as roofers, plumbers, and gardeners.  People with another inflammatory condition, such as gout or rheumatoid arthritis. What are the signs or symptoms? The most common symptom of this condition is knee pain that gets better with rest. Other symptoms include:  Swelling on the front of the kneecap.  Warmth in the knee.  Tenderness with activity.  Redness in the knee.  Inability to bend the knee or to kneel. How is this diagnosed? This condition may be diagnosed based on:  Your symptoms.  Your medical history.  A physical exam. During the exam, your provider will compare your knees and check for tenderness and pain when moving your knee. Your health care provider may also use a needle to remove fluid from the bursa to help diagnose an infection.  Tests, such as:  A blood test that checks for infection.  X-rays. These may be taken to check the structure of the patella.  MRI or ultrasound. These may be done to check for swelling and fluid buildup in the bursa. How is this treated? This condition may be treated by:  Resting the knee.  Applying ice to the knee.  Medicine, such  as:  Nonsteroidal anti-inflammatory drugs (NSAIDs). These can help to reduce pain and swelling.  Antibiotic medicines. These may be needed if you have an infection.  Steroid medicines. These may be prescribed if other treatments are not helping.  Raising (elevating) the knee while resting.  Doing strengthening and stretching exercises (physical therapy). These may be recommended after pain and swelling improve.  Having a procedure to remove fluid from the bursa. This may be done if other treatment is not helping.  Having surgery to remove the bursa. This may be done if you have a severe infection or if the condition keeps coming back after treatment. Follow these instructions at home: Medicines  Take over-the-counter and prescription medicines only as told by your health care provider.  If you were prescribed an antibiotic medicine, take it as told by your health care provider. Do not stop taking the antibiotic even if you start to feel better. Managing pain, stiffness, and swelling  If directed, apply ice to your knee.  Put ice in a plastic bag.  Place a towel between your skin and the bag.  Leave the ice on for 20 minutes, 2-3 times a day.  Elevate your knee above the level of your heart while you are sitting or lying down. Driving  Do not drive or operate heavy machinery while taking prescription pain medicine.  Ask your health care provider when it is safe fpr you to drive. Activity  Rest your knee.  Avoid activities that cause pain.  Return to your normal activities as told by your health care provider. Ask your health care provider what activities are safe for you.  Do exercises as told by your health care provider. General instructions  Do not use the injured limb to support your body weight until your health care provider says that you can.  Do not use any tobacco products, such as cigarettes, chewing tobacco, and e-cigarettes. Tobacco can delay bone healing.  If you need help quitting, ask your health care provider.  Keep all follow-up visits as told by your health care provider. This is important. How is this prevented?  Warm up and stretch before being active.  Cool down and stretch after being active.  Give your body time to rest between periods of activity.  Make sure to use equipment that fits you.  Be safe and responsible while being active to avoid falls.  Do at least 150 minutes of moderate-intensity exercise each week, such as brisk walking or water aerobics.  Maintain physical fitness, including:  Strength.  Flexibility.  Cardiovascular fitness.  Endurance. Contact a health care provider if:  Your symptoms do not improve.  Your symptoms get worse.  Your symptoms keep coming back after treatment.  You develop a fever and have warmth, redness, and swelling over your knee. This information is not intended to replace advice given to you by your health care provider. Make sure you discuss any questions you have with your health care provider. Document Released: 07/08/2005 Document Revised: 03/12/2016 Document Reviewed: 04/07/2015 Elsevier Interactive Patient Education  2017 [employer].  Follow up for any recurrent redness, heat, fever, or chills.

## 2016-11-14 DIAGNOSIS — M9903 Segmental and somatic dysfunction of lumbar region: Secondary | ICD-10-CM | POA: Diagnosis not present

## 2016-11-14 DIAGNOSIS — M5431 Sciatica, right side: Secondary | ICD-10-CM | POA: Diagnosis not present

## 2016-11-26 DIAGNOSIS — M25562 Pain in left knee: Secondary | ICD-10-CM | POA: Diagnosis not present

## 2017-02-10 ENCOUNTER — Ambulatory Visit (INDEPENDENT_AMBULATORY_CARE_PROVIDER_SITE_OTHER): Payer: 59 | Admitting: Sports Medicine

## 2017-02-10 VITALS — BP 120/70 | Ht 74.0 in | Wt 225.0 lb

## 2017-02-10 DIAGNOSIS — M7522 Bicipital tendinitis, left shoulder: Secondary | ICD-10-CM

## 2017-02-10 MED ORDER — DICLOFENAC SODIUM 75 MG PO TBEC: 75.0000 mg | DELAYED_RELEASE_TABLET | Freq: Two times a day (BID) | ORAL | 0 refills | Status: DC

## 2017-02-10 NOTE — Progress Notes (Signed)
   Subjective:    Patient ID: Brendan Brady, male    DOB: 1981/01/13, 35 y.o.   MRN: [id number]  HPI cc: left shoulder pain  Patient presents with left shoulder pain that has been bothersome for past 1 month. He localizes the pain to his anterior left shoulder. 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 He is left handed but throws with his right arm, catches with his left. He has tried ibuprofen with minimal relief. Denies injury or trauma to the area.    PMH- lipomas meds- NSAIDs as needed surg- lipoma removal allg- latex  Review of Systems- denies neck pain, numbness/tingling in arm, arm weakness     Objective:   Physical Exam  Gen: well nourished well developed in NAD  Left shoulder: no swelling or deformity noted. Tender to palpation over bicipital groove. Normal ROM in flexion, extension, internal and external rotation of shoulder. Strength normal. Positive Speeds and Yergason test. Negative empty can. Symmetrical upper extremity reflexes bilaterally.  Neurovascularly in tact.       Assessment & Plan:   Left shoulder pain- secondary to biceps tendonitis -rx given for diclofenac to take BID for next 7 days -advised relative rest -follow up in 2-3 weeks if anti-inflammatory does not improve pain -consider shoulder [place] if pain persists  Lucila Maine, DO PGY-2, [facility] 02/10/2017 10:50 AM   Patient seen and evaluated with the resident. I agree with the above plan of care. Patient will take diclofenac for the next 7-10 days. If symptoms persist then he will return to the office for an ultrasound evaluation of his shoulder.

## 2017-02-10 NOTE — Patient Instructions (Addendum)
It was nice meeting you today! Please take Diclofenac twice a day for 7 days then as needed if the pain returns. Please follow up in 2-3 weeks if the pain worsens or does not improve so Dr. Micheline Chapman can do an [place] of your shoulder.   Lucila Maine, DO PGY-2, [facility] 02/10/2017 10:34 AM   Biceps Tendon Tendinitis (Proximal) and Tenosynovitis The proximal biceps tendon is a strong cord of tissue that connects the biceps muscle, on the front of the upper arm, to the shoulder blade. Tendinitis is inflammation of a tendon. Tenosynovitis is inflammation of the lining around the tendon (tendon sheath). These conditions often occur at the same time, and they can interfere with the ability to bend the elbow and turn the hand palm-up (supination). Proximal biceps tendon tendinitis and tenosynovitis are usually caused by overusing the shoulder joint and the biceps muscle. These conditions usually heal within 6 weeks. Proximal biceps tendon tendinitis may include a grade 1 or grade 2 strain of the tendon. A grade 1 strain is mild, and it involves a slight pull of the tendon without any stretching or noticeable tearing of the tendon. There is usually no loss of biceps muscle strength. A grade 2 strain is moderate, and it involves a small tear in the tendon. The tendon is stretched, and biceps strength is usually decreased. What are the causes? This condition may be caused by:  A sudden increase in frequency or intensity of activity that involves the shoulder and the biceps muscle.  Overuse of the biceps muscle. This can happen when you do the same movements over and over, such as: ? Supination. ? Forceful straightening (hyperextension) of the elbow. ? Bending the elbow.  A direct, forceful hit or injury (trauma) to the elbow. This is rare.  What increases the risk? The following factors may make you more likely to develop this condition:  Playing contact sports.  Playing sports that  involve throwing and overhead movements, including racket sports, gymnastics, weight lifting, or bodybuilding.  Doing physical labor.  Having poor strength and flexibility of the arm and shoulder.  What are the signs or symptoms? Symptoms of this condition may include:  Pain and inflammation in the front of the shoulder. Pain may get worse with movement, especially when you use resistance, as in weight lifting.  A feeling of warmth in the front of the shoulder.  Limited range of motion of the shoulder and the elbow.  A crackling sound (crepitation) when you move or touch the shoulder or the upper arm.  In some cases, symptoms may return (recur) after treatment, and they may be long-lasting (chronic). How is this diagnosed? This condition is diagnosed based on your symptoms, your medical history, and a physical exam. You may have tests, including X-rays or MRIs. Your health care provider may test your range of motion by asking you to do arm movements. How is this treated? This condition is treated by resting and icing the injured area, and by doing physical therapy exercises. Depending on the severity of your condition, treatment may also include:  Medicines to help relieve pain and inflammation.  Ultrasound therapy. This is the application of sound waves to the injured area.  Injecting medicines (corticosteroids) into your tendon sheath.  Injecting medicines that numb the area (local anesthetics).  Surgery to remove the damaged part of the tendon and reattach the undamaged part of the tendon to the arm bone (humerus). This is usually only done if you  have symptoms that do not get better with other treatment methods.  Follow these instructions at home: Managing pain, stiffness, and swelling  If directed, put ice on the injured area: ? Put ice in a plastic bag. ? Place a towel between your skin and the bag. ? Leave the ice on for 20 minutes, 2-3 times a day.  Move your fingers  often to avoid stiffness and to lessen swelling.  Raise (elevate) the injured area above the level of your heart while you are sitting or lying down.  If directed, apply heat to the affected area before you exercise. Use the heat source that your health care provider recommends, such as a moist heat pack or a heating pad. ? Place a towel between your skin and the heat source. ? Leave the heat on for 20-30 minutes. ? Remove the heat if your skin turns bright red. This is especially important if you are unable to feel pain, heat, or cold. You may have a greater risk of getting burned. Activity  Return to your normal activities as told by your health care provider. Ask your health care provider what activities are safe for you.  Do not lift anything that is heavier than 10 lb (4.5 kg) until your health care provider tells you that it is safe.  Avoid activities that cause pain or make your condition worse.  Do exercises as told by your health care provider. General instructions  Take over-the-counter and prescription medicines only as told by your health care provider.  Do not drive or operate heavy machinery while taking prescription pain medicines.  Keep all follow-up visits as told by your health care provider. This is important. How is this prevented?  Warm up and stretch before being active.  Cool down and stretch after being active.  Give your body time to rest between periods of activity.  Make sure any equipment that you use is fitted to you.  Be safe and responsible while being active to avoid falls.  Do at least 150 minutes of moderate-intensity aerobic exercise each week, such as brisk walking or water aerobics.  Maintain physical fitness, including: ? Strength. ? Flexibility. ? Cardiovascular fitness. ? Endurance. Contact a health care provider if:  You have symptoms that get worse or do not get better after 2 weeks of treatment.  You develop new symptoms. Get  help right away if:  You develop severe pain. This information is not intended to replace advice given to you by your health care provider. Make sure you discuss any questions you have with your health care provider. Document Released: 07/08/2005 Document Revised: 03/14/2016 Document Reviewed: 06/16/2015 Elsevier Interactive Patient Education  [employer].

## 2017-02-18 ENCOUNTER — Ambulatory Visit: Payer: 59 | Admitting: Sports Medicine

## 2017-03-27 DIAGNOSIS — L308 Other specified dermatitis: Secondary | ICD-10-CM | POA: Diagnosis not present

## 2017-06-09 DIAGNOSIS — R002 Palpitations: Secondary | ICD-10-CM | POA: Diagnosis not present

## 2017-06-09 DIAGNOSIS — R9431 Abnormal electrocardiogram [ECG] [EKG]: Secondary | ICD-10-CM | POA: Diagnosis not present

## 2017-06-11 DIAGNOSIS — R9431 Abnormal electrocardiogram [ECG] [EKG]: Secondary | ICD-10-CM | POA: Diagnosis not present

## 2017-06-16 DIAGNOSIS — R002 Palpitations: Secondary | ICD-10-CM | POA: Diagnosis not present

## 2017-06-16 DIAGNOSIS — R9431 Abnormal electrocardiogram [ECG] [EKG]: Secondary | ICD-10-CM | POA: Diagnosis not present

## 2017-10-15 DIAGNOSIS — R6883 Chills (without fever): Secondary | ICD-10-CM | POA: Diagnosis not present

## 2017-10-15 DIAGNOSIS — J069 Acute upper respiratory infection, unspecified: Secondary | ICD-10-CM | POA: Diagnosis not present

## 2018-01-05 DIAGNOSIS — L309 Dermatitis, unspecified: Secondary | ICD-10-CM | POA: Diagnosis not present

## 2018-01-05 DIAGNOSIS — L308 Other specified dermatitis: Secondary | ICD-10-CM | POA: Diagnosis not present

## 2018-01-05 DIAGNOSIS — B028 Zoster with other complications: Secondary | ICD-10-CM | POA: Diagnosis not present

## 2018-02-04 DIAGNOSIS — L859 Epidermal thickening, unspecified: Secondary | ICD-10-CM | POA: Diagnosis not present

## 2018-02-04 DIAGNOSIS — L301 Dyshidrosis [pompholyx]: Secondary | ICD-10-CM | POA: Diagnosis not present

## 2018-02-10 DIAGNOSIS — R0602 Shortness of breath: Secondary | ICD-10-CM | POA: Diagnosis not present

## 2018-02-10 DIAGNOSIS — E781 Pure hyperglyceridemia: Secondary | ICD-10-CM | POA: Diagnosis not present

## 2018-02-10 DIAGNOSIS — E559 Vitamin D deficiency, unspecified: Secondary | ICD-10-CM | POA: Diagnosis not present

## 2018-07-20 ENCOUNTER — Ambulatory Visit: Payer: 59 | Admitting: Sports Medicine

## 2018-07-20 ENCOUNTER — Encounter: Payer: Self-pay | Admitting: Sports Medicine

## 2018-07-20 VITALS — BP 130/88 | Ht 74.0 in | Wt 240.0 lb

## 2018-07-20 DIAGNOSIS — M7661 Achilles tendinitis, right leg: Secondary | ICD-10-CM

## 2018-07-20 MED ORDER — NITROGLYCERIN 0.2 MG/HR TD PT24: MEDICATED_PATCH | TRANSDERMAL | 1 refills | Status: DC

## 2018-07-20 NOTE — Progress Notes (Signed)
   Subjective:    Patient ID: Brendan Brady, male    DOB: 16-Nov-1980, 37 y.o.   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  He does not recall any specific injury but rather describes a gradual onset of pain that started after he began a new exercise program.  He localizes the pain to the mid substance of the right Achilles.  He has noticed some swelling in this area as well.  He is continue to work despite his discomfort.  It is worse with prolonged standing and walking, especially in his work boots.  He has not tried any consistent treatment.  He has tried some occasional ice and ibuprofen with minimal benefit.  He was seen by a physician at the fire department and diagnosed with Achilles tendinitis.  X-ray was done at that time.  He is otherwise healthy.  Interim medical history reviewed Medications reviewed Allergies reviewed    Review of Systems    As above Objective:   Physical Exam  Well-developed, well-nourished.  No acute distress.  Awake alert and oriented x3.  Vital signs reviewed  Right Achilles: There is fusiform swelling of the mid substance of the Achilles.  He is tender to palpation here.  No tenderness distally at the calcaneus.  Good strength.  Good pulses.      Assessment & Plan:   Chronic right Achilles tendinopathy  I explained to the patient that this will take a while to heal.  He will start eccentric heel drop exercises daily and I will give him a 5/16 inch heel lift to wear in his shoes.  He will start topical nitroglycerin (quarter patch daily) and he will follow-up with me in 4 weeks.  We discussed the possibility of formal physical therapy but he would like to hold on that for now.

## 2018-07-20 NOTE — Patient Instructions (Signed)
Nitroglycerin Protocol   Apply 1/4 nitroglycerin patch to affected area daily.  Change position of patch within the affected area every 24 hours.  You may experience a headache during the first 1-2 weeks of using the patch, these should subside.  If you experience headaches after beginning nitroglycerin patch treatment, you may take your preferred over the counter pain reliever.  Another side effect of the nitroglycerin patch is skin irritation or rash related to patch adhesive.  Please notify our office if you develop more severe headaches or rash, and stop the patch.  Tendon healing with nitroglycerin patch may require 12 to 24 weeks depending on the extent of injury.  Men should not use if taking Viagra, Cialis, or Levitra.   Do not use if you have migraines or rosacea.    Follow-up with [place] in 4 weeks.

## 2018-08-24 ENCOUNTER — Ambulatory Visit: Payer: 59 | Admitting: Sports Medicine

## 2019-08-16 ENCOUNTER — Encounter: Payer: Self-pay | Admitting: Neurology

## 2019-08-17 ENCOUNTER — Ambulatory Visit (INDEPENDENT_AMBULATORY_CARE_PROVIDER_SITE_OTHER): Payer: 59 | Admitting: Neurology

## 2019-08-17 ENCOUNTER — Other Ambulatory Visit: Payer: Self-pay

## 2019-08-17 ENCOUNTER — Encounter: Payer: Self-pay | Admitting: Neurology

## 2019-08-17 DIAGNOSIS — R0683 Snoring: Secondary | ICD-10-CM

## 2019-08-17 DIAGNOSIS — G4710 Hypersomnia, unspecified: Secondary | ICD-10-CM

## 2019-08-17 DIAGNOSIS — G4730 Sleep apnea, unspecified: Secondary | ICD-10-CM

## 2019-08-17 DIAGNOSIS — G4726 Circadian rhythm sleep disorder, shift work type: Secondary | ICD-10-CM

## 2019-08-17 NOTE — Patient Instructions (Signed)

## 2019-08-17 NOTE — Progress Notes (Signed)
SLEEP MEDICINE CLINIC    Provider:  Larey Seat, MD  Primary Care Physician:  Sueanne Margarita, [place] [address]     Referring Provider: Sueanne Margarita, Do [address] [place],  [place] [address]          Chief Complaint according to patient   Patient presents with:    . New Patient (Initial Visit)     RN : 10. p10. pt states that he has complaints of feeling tired all the time. he contributes most of that to life in general. 3 kids, works at [employer] his wife has informed him he snores and she has witnessed apnea's.t states that he has complaints of feeling tired all the time. he contributes most of that to life in general. 3 kids, works at [employer] his wife has informed him he snores and she has witnessed apnea's.      HISTORY OF PRESENT ILLNESS:  Brendan Brady is a 39 year old Caucasian male patient seen here upon referral from Dr. Francesco Sor on 08/17/2019 Chief concern according to patient :" I am always tired. I work  24 h shifts". His family also has a Copywriter, advertising , and he really works 2 jobs.    I have the pleasure of seeing Brendan Brady today, a right-handed Caucasian male with a possible sleep disorder.  He  has a past medical history of Bursitis of right knee, Chicken pox, HLD (hyperlipidemia), and Lipoma. he had his first of 2 shots by [employer], COVID 19 vaccine.   Sleep relevant medical history: Nocturia: 1-2, Sleep walking in childhood, sleep eating now. Family medical /sleep history: No other family member on CPAP with OSA.    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 Children are home schooled, wife is a Pharmacist, hospital.  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 Tobacco use: never   ETOH use; very rarely, Caffeine intake in form of Coffee( one) Soda( some) Tea (some) , but  energy drinks- at  least one Monster a day. Regular exercise in form of physical work, 3 weekly in the gym .    Sleep habits are as follows: The patient's dinner time is between 6-8 PM. The patient goes to bed at 10 PM and continues to sleep for 2 hours, wakes for one " hungry break "  at 1 AM.   The preferred sleep position is on the side, with the support of 2 pillow.  Dreams are reportedly rare.  5.30 AM is the usual rise time. The patient wakes up with an alarm.  He reports not feeling refreshed or restored in AM, with symptoms such as dry mouth, morning headaches, and residual fatigue. Wife has witnessed apneas and snoring  Naps are taken infrequently.    Review of Systems: Out of a complete 14 system review, the patient complains of only the following symptoms, and all other reviewed systems are negative.:  Fatigue, sleepiness , snoring,  fragmented sleep, Insomnia How likely are you to doze in the following situations: 0 = not likely, 1 = slight chance, 2 = moderate chance, 3 = high chance   Sitting and Reading? Watching Television? Sitting inactive in a public place (theater or meeting)? As a passenger in a car for an hour without a break? Lying down in the  afternoon when circumstances permit? Sitting and talking to someone? Sitting quietly after lunch without alcohol? In a car, while stopped for a few minutes in traffic?   Total = 2/ 24 points    Social History   Socioeconomic History  . Marital status: Married    Spouse name: Not on file  . Number of children: Not on file  . Years of education: Not on file  . Highest education level: Not on file  Occupational History  . Not on file  Tobacco Use  . Smoking status: Never Smoker  . Smokeless tobacco: Never Used  Substance and Sexual Activity  . Alcohol use: Yes    Comment: Social  . Drug use: No  . Sexual activity: Not on file  Other Topics Concern  . Not on file  Social History Narrative  . Not on file   Social Determinants  of Health   Financial Resource Strain:   . Difficulty of Paying Living Expenses: Not on file  Food Insecurity:   . Worried About Charity fundraiser in the Last Year: Not on file  . Ran Out of Food in the Last Year: Not on file  Transportation Needs:   . Lack of Transportation (Medical): Not on file  . Lack of Transportation (Non-Medical): Not on file  Physical Activity:   . Days of Exercise per Week: Not on file  . Minutes of Exercise per Session: Not on file  Stress:   . Feeling of Stress : Not on file  Social Connections:   . Frequency of Communication with Friends and Family: Not on file  . Frequency of Social Gatherings with Friends and Family: Not on file  . Attends Religious Services: Not on file  . Active Member of Clubs or Organizations: Not on file  . Attends Archivist Meetings: Not on file  . Marital Status: Not on file    Family History  Problem Relation Age of Onset  . Cancer Mother        Breast  . Arthritis Father     Past Medical History:  Diagnosis Date  . Bursitis of right knee   . Chicken pox   . HLD (hyperlipidemia)   . Lipoma     Past Surgical History:  Procedure Laterality Date  . CYST EXCISION  1998  . VASECTOMY  2018     Current Outpatient Medications on File Prior to Visit  Medication Sig Dispense Refill  . ibuprofen (ADVIL,MOTRIN) 200 MG tablet Take 200 mg by mouth every 6 (six) hours as needed for mild pain.     No current facility-administered medications on file prior to visit.    Allergies  Allergen Reactions  . Latex     Physical exam:  There were no vitals filed for this visit. There is no height or weight on file to calculate BMI.   Wt Readings from Last 3 Encounters:  07/20/18 240 lb (108.9 kg)  02/10/17 225 lb (102.1 kg)  09/10/16 232 lb (105.2 kg)     Ht Readings from Last 3 Encounters:  07/20/18 6\' 2"  (1.88 m)  02/10/17 6\' 2"  (1.88 m)  09/10/16 6\' 2"  (1.88 m)      General: The patient is awake,  alert and appears not in acute distress. The patient is well groomed. Head: Normocephalic, atraumatic. Neck is supple. Mallampati 3 - swollen uvula . neck circumference:18 inches . Nasal airflow patent.  Retrognathia is mild-  Had braces as a kid.   Dental  status: intact.  Cardiovascular:  Regular rate and cardiac rhythm by pulse,  without distended neck veins. Respiratory: Lungs are clear to auscultation.  Skin:  Without evidence of ankle edema, or rash. Trunk: The patient's posture is erect.   Neurologic exam : The patient is awake and alert, oriented to place and time.   Memory subjective described as intact.  Attention span & concentration ability appears normal.  Speech is fluent,  without dysarthria, mild dysphonia , no aphasia.  Mood and affect are appropriate.   Cranial nerves: no loss of smell or taste reported  Pupils are equal and briskly reactive to light.  Funduscopic exam deferred.  Extraocular movements in vertical and horizontal planes were intact and without nystagmus. No Diplopia. Visual fields by finger perimetry are intact. Hearing was intact to soft voice and finger rubbing.    Facial sensation intact to fine touch.  Facial motor strength is symmetric and tongue and uvula move midline.  Neck ROM : rotation, tilt and flexion extension were normal for age. His left shoulder shrug was lower.  Reports shoulder tension, neck tension.    Motor exam:  Symmetric bulk, tone and ROM.   Normal tone without cog wheeling, symmetric grip strength . Sensory:  Fine touch, pinprick and vibration were  normal.  Proprioception tested in the upper extremities was normal. Coordination: Rapid alternating movements in the fingers/hands were of normal speed.  The Finger-to-nose maneuver was intact without evidence of ataxia, dysmetria or tremor. Gait and station: Patient could rise unassisted from a seated position, walked without assistive device.  Stance is of normal width/ base and  the patient turned with 3 steps ( RN observation).  Toe and heel walk were deferred.  Deep tendon reflexes: in the  upper and lower extremities are symmetric and intact.  Babinski response was deferred.      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 He has woken himself up, gasping, choking.  His larger uvula seems swollen and may indicate GERD to be present.  Sleep hygiene.His middle and youngest sons come to the parental bed- and this interrupts his parents sleep.   sleep eating - have a n bedtime snack that gives lasting energy- peanut butter- almond butter  My Plan is to proceed with:  1) HST for screening and determination of apnea type.  2) sleep-time snack.   I would like to thank Sueanne Margarita, DO ,[place],   [id number] for allowing me to meet with and to take care of this pleasant patient.   I plan to follow up either personally or through our NP within 2-3 month.    Electronically signed by: Larey Seat, MD 08/17/2019 9:15 AM  [facility] Board certified by The [employer] of Sleep Medicine and Diplomate of the [employer] of Sleep Medicine. Board certified In Neurology through the [employer], Fellow of the [employer] of Neurology. Medical Director of [facility].

## 2019-09-01 ENCOUNTER — Ambulatory Visit (INDEPENDENT_AMBULATORY_CARE_PROVIDER_SITE_OTHER): Payer: 59 | Admitting: Neurology

## 2019-09-01 DIAGNOSIS — G4733 Obstructive sleep apnea (adult) (pediatric): Secondary | ICD-10-CM

## 2019-09-01 DIAGNOSIS — G4726 Circadian rhythm sleep disorder, shift work type: Secondary | ICD-10-CM

## 2019-09-01 DIAGNOSIS — R0683 Snoring: Secondary | ICD-10-CM

## 2019-09-01 DIAGNOSIS — G4710 Hypersomnia, unspecified: Secondary | ICD-10-CM | POA: Diagnosis not present

## 2019-09-01 DIAGNOSIS — G4730 Sleep apnea, unspecified: Secondary | ICD-10-CM

## 2019-09-14 DIAGNOSIS — G4726 Circadian rhythm sleep disorder, shift work type: Secondary | ICD-10-CM | POA: Insufficient documentation

## 2019-09-14 DIAGNOSIS — R0683 Snoring: Secondary | ICD-10-CM | POA: Insufficient documentation

## 2019-09-14 DIAGNOSIS — G4710 Hypersomnia, unspecified: Secondary | ICD-10-CM | POA: Insufficient documentation

## 2019-09-14 DIAGNOSIS — G4730 Sleep apnea, unspecified: Secondary | ICD-10-CM | POA: Insufficient documentation

## 2019-09-14 NOTE — Addendum Note (Signed)
Addended by: Larey Seat on: 09/14/2019 05:54 PM   Modules accepted: Orders

## 2019-09-14 NOTE — Procedures (Signed)
Patient Information     First Name: Sora Last Name: Boulos ID: [id number]  Birth Date: 04-Aug-1980 Age: 39 Gender: Male  Referring Provider: Sueanne Margarita, DO BMI: 30.8 (W=240 lb, H=6' 2'')  Neck Circ.:  18 '' Epworth:  2/24   Sleep Study Information    Study Date: Sep 01, 2019 S/H/A Version: 001.001.001.001 / 4.0.1515 / 71  History:     Erubey Suppes is a 39 year -old Caucasian male patient seen here upon referral from Dr. Francesco Sor on 08/17/2019. Chief concern according to patient:" I am always tired. I work 24 hour shifts". His family also has a Copywriter, advertising, and he really works 2 jobs. Vontrell Stendahl is a right-handed Caucasian male with medical history of Bursitis of right knee, Chicken pox, HLD (hyperlipidemia), and Lipoma. He has woken up gasping, choking and may have GERD as well as OSA.       Summary & Diagnosis:    Moderate degree of Obstructive Sleep Apnea at AHI 21.8/h without exacerbation during REM sleep. Supine sleep accentuated the AHI to 43.1/h- severe. There was intermittent bradycardia noted to a low heart rate of 37 bpm but no hypoxemia of clinical significance could be correlated.     Recommendations:     Avoiding supine sleep is the first advise, and using CPAP for the treatment of moderate OSA is my second treatment step. I will order an autotitration capable CPAP device with a setting of 6-16 cm water, 2 cm EPR and heated humidity for this patient, and ask the DME to provide him with an interface fitting.  In theory he could also use a dental device as he has no significant hypoxia associated, should Mr. Penrose not tolerate CPAP well.  Interpreting Physician: Larey Seat, MD             Sleep Summary  Oxygen Saturation Statistics   Start Study Time: End Study Time: Total Recording Time:  8:33:10 PM 4:58:49 AM 8 h, 25 min  Total Sleep Time % REM of Sleep Time:  6 h, 40 min 23.8    Mean: 94 Minimum: 85 Maximum: 98  Mean of Desaturations Nadirs (%):    91  Oxygen Desaturation. %:   4-9 10-20 >20 Total  Events Number Total    87  1 98.9 1.1  0 0.0  88 100.0  Oxygen Saturation: <90 <=88 <85 <80 <70  Duration (minutes): Sleep % 1.9 0.5  0.8 0.0  0.2 0.0 0.0 0.0 0.0 0.0     Respiratory Indices      Total Events REM NREM All Night  pRDI:  165  pAHI:  145 ODI:  88  pAHIc:  13  % CSR: 0.0 29.7 22.2 15.2 2.9 23.3 21.7 12.6 1.8 24.8 21.8 13.2 2.0       Pulse Rate Statistics during Sleep (BPM)      Mean: 73 Minimum: 37 Maximum: 104    Indices are calculated using technically valid sleep time of  6 h, 39 min. Central-Indices are calculated using technically valid sleep time of  6  h, 27 min. pRDI/pAHI are calculated using 02 desaturations ? 3%  Body Position Statistics  Position Supine Prone Right Left Non-Supine  Sleep (min) 167.5 62.0 133.1 38.0 233.1  Sleep % 41.8 15.5 33.2 9.5 58.2  pRDI 45.2 12.7 8.6 11.1 10.1  pAHI 43.1 5.8 6.3 7.9 6.5  ODI 28.0 2.9 2.3 3.2 2.6     Snoring Statistics Snoring Level (dB) >40 >50 >60 >70 >  80 >Threshold (45)  Sleep (min) 249.8 82.5 13.5 0.0 0.0 128.5  Sleep % 62.3 20.6 3.4 0.0 0.0 32.1    Mean: 45 dB

## 2019-09-14 NOTE — Progress Notes (Signed)
Avoiding supine sleep is the first treatment step and using CPAP for the treatment of moderate OSA is my second treatment recommendation. I will order an autotitration capable CPAP device with a setting of 6-16 cm water, 2 cm EPR and heated humidity for this patient, and ask the DME to provide him with an interface fitting.  In theory he could also use a dental device as he has no significant hypoxia associated, should Mr. Davia not tolerate CPAP well.  Interpreting Physician: Larey Seat, MD

## 2019-09-15 ENCOUNTER — Telehealth: Payer: Self-pay | Admitting: Neurology

## 2019-09-15 NOTE — Telephone Encounter (Signed)
I called pt. I advised pt that Dr. Brett Fairy reviewed their sleep study results and found that pt has moderate sleep apnea. Dr. Brett Fairy recommends that pt starts auto CPAP. I reviewed PAP compliance expectations with the pt. Pt is agreeable to starting a CPAP. I advised pt that an order will be sent to a DME, [facility], and [facility] will call the pt within about one week after they file with the pt's insurance. [facility] will show the pt how to use the machine, fit for masks, and troubleshoot the CPAP if needed. A follow up appt was made for insurance purposes with Debbora Presto, NP on May 5,2021 at 8 am. Pt verbalized understanding to arrive 15 minutes early and bring their CPAP. A letter with all of this information in it will be mailed to the pt as a reminder. I verified with the pt that the address we have on file is correct. Pt verbalized understanding of results. Pt had no questions at this time but was encouraged to call back if questions arise. I have sent the order to [facility] and have received confirmation that they have received the order.

## 2019-09-15 NOTE — Telephone Encounter (Signed)
-----   Message from Larey Seat, MD sent at 09/14/2019  5:54 PM EST ----- Avoiding supine sleep is the first treatment step and using CPAP for the treatment of moderate OSA is my second treatment recommendation. I will order an autotitration capable CPAP device with a setting of 6-16 cm water, 2 cm EPR and heated humidity for this patient, and ask the DME to provide him with an interface fitting.  In theory he could also use a dental device as he has no significant hypoxia associated, should Mr. Cullipher not tolerate CPAP well.  Interpreting Physician: Larey Seat, MD

## 2019-11-23 ENCOUNTER — Telehealth: Payer: Self-pay

## 2019-11-23 DIAGNOSIS — G4733 Obstructive sleep apnea (adult) (pediatric): Secondary | ICD-10-CM | POA: Insufficient documentation

## 2019-11-23 NOTE — Progress Notes (Signed)
PATIENT: Brendan Brady DOB: 1980-11-08  REASON FOR VISIT: follow up HISTORY FROM: patient  Chief Complaint  Patient presents with  . Follow-up    cpap fu, rm 5, alone, pt states he is doing well on cpap      HISTORY OF PRESENT ILLNESS: Today 11/24/19 Brendan Brady is a 39 y.o. male here today for follow up for OSA recently started on CPAP therapy.  Home sleep study in February demonstrated moderate obstructive sleep apnea with AHI 21.8.  He was started on CPAP therapy. He does report feeling better. He sleeps better and wakes feeling more refreshed. 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 He also has three small kids that interrupt sleep when he is at home. He denies any concerns with machine or supplies.   Compliance report dated 10/25/2019 through 11/23/2019 reveals that he has used CPAP therapy 27 of the past 30 days for compliance of 90%.  He has used CPAP greater than 4 hours 20 at the past 30 days for compliance of 67%.  Average usage was 4 hours and 55 minutes.  Residual AHI is 1.7 on 6 to 16 cm of water and an EPR of 3.  There was no significant leak noted.   HISTORY: (copied from Dr Edwena Felty note on 08/17/2019)  Brendan Brady a 39 year old Caucasian male patientseen here upon referralfrom Dr. Francesco Sor on 08/17/2019 Chiefconcernaccording to patient :" I am always tired. I work  24 h shifts". His family also has a Copywriter, advertising , and he really works 2 jobs.    I have the pleasure of seeing Brendan Brady today,a right-handed Caucasian male with a possible sleep disorder.  He  has a past medical history of Bursitis of right knee, Chicken pox, HLD (hyperlipidemia), and Lipoma. he had his first of 2 shots by [employer], COVID 19 vaccine.   Sleeprelevant medical history: Nocturia: 1-2, Sleep walking in childhood, sleep eating now. Familymedical /sleep history: No other family member on CPAP with OSA.  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 Children are home schooled, wife is a Pharmacist, hospital.  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 Tobacco use: never  ETOH use; very rarely, Caffeine intake in form of Coffee( one) Soda( some) Tea (some) , but  energy drinks- at least one Monster a day. Regular exercise in form of physical work, 3 weekly in the gym .    Sleep habits are as follows:The patient's dinner time is between 6-8 PM. The patient goes to bed at 10 PM and continues to sleep for 2 hours, wakes for one " hungry break "  at 1 AM.   The preferred sleep position is on the side, with the support of 2 pillow.  Dreams are reportedly rare.  5.30 AM is the usual rise time. The patient wakes up with an alarm.  He reports not feeling refreshed or restored in AM, with symptoms such as dry mouth, morning headaches, and residual fatigue. Wife has witnessed apneas and snoring  Naps are taken infrequently.    REVIEW OF SYSTEMS: Out of a complete 14 system review of symptoms, the patient complains only of the following symptoms, none and all other reviewed systems are negative.  ESS: 6 FSS: 26  ALLERGIES: Allergies  Allergen Reactions  . Latex     HOME MEDICATIONS: Outpatient Medications  Prior to Visit  Medication Sig Dispense Refill  . ibuprofen (ADVIL,MOTRIN) 200 MG tablet Take 200 mg by mouth every 6 (six) hours as needed for mild pain.     No facility-administered medications prior to visit.    PAST MEDICAL HISTORY: Past Medical History:  Diagnosis Date  . Bursitis of right knee   . Chicken pox   . HLD (hyperlipidemia)   . Lipoma     PAST SURGICAL HISTORY: Past Surgical History:  Procedure Laterality Date  . CYST EXCISION  1998  . VASECTOMY  2018    FAMILY HISTORY: Family History  Problem Relation Age of Onset  . Cancer Mother        Breast  . Arthritis Father     SOCIAL HISTORY: Social  History   Socioeconomic History  . Marital status: Married    Spouse name: Not on file  . Number of children: Not on file  . Years of education: Not on file  . Highest education level: Not on file  Occupational History  . Not on file  Tobacco Use  . Smoking status: Never Smoker  . Smokeless tobacco: Never Used  Substance and Sexual Activity  . Alcohol use: Yes    Comment: Social  . Drug use: No  . Sexual activity: Not on file  Other Topics Concern  . Not on file  Social History Narrative  . Not on file   Social Determinants of Health   Financial Resource Strain:   . Difficulty of Paying Living Expenses:   Food Insecurity:   . Worried About Charity fundraiser in the Last Year:   . Arboriculturist in the Last Year:   Transportation Needs:   . Film/video editor (Medical):   Marland Kitchen Lack of Transportation (Non-Medical):   Physical Activity:   . Days of Exercise per Week:   . Minutes of Exercise per Session:   Stress:   . Feeling of Stress :   Social Connections:   . Frequency of Communication with Friends and Family:   . Frequency of Social Gatherings with Friends and Family:   . Attends Religious Services:   . Active Member of Clubs or Organizations:   . Attends Archivist Meetings:   Marland Kitchen Marital Status:   Intimate Partner Violence:   . Fear of Current or Ex-Partner:   . Emotionally Abused:   Marland Kitchen Physically Abused:   . Sexually Abused:       PHYSICAL EXAM  Vitals:   11/24/19 0738  BP: 123/74  Pulse: 74  Temp: (!) 97.1 F (36.2 C)  Weight: 242 lb (109.8 kg)  Height: 6\' 2"  (1.88 m)   Body mass index is 31.07 kg/m.  Generalized: Well developed, in no acute distress  Cardiology: normal rate and rhythm, no murmur noted Respiratory: clear to auscultation bilaterally  Neurological examination  Mentation: Alert oriented to time, place, history taking. Follows all commands speech and language fluent Cranial nerve II-XII: Pupils were equal round  reactive to light. Extraocular movements were full Motor: The motor testing reveals 5 over 5 strength of all 4 extremities. Good symmetric motor tone is noted throughout.  Gait and station: Gait is normal.   DIAGNOSTIC DATA (LABS, IMAGING, TESTING) - I reviewed patient records, labs, notes, testing and imaging myself where available.  No flowsheet data found.   Lab Results  Component Value Date   WBC 5.7 10/18/2015   HGB 15.2 10/18/2015   HCT 44.2 10/18/2015   MCV  93.3 10/18/2015   PLT 225.0 10/18/2015      Component Value Date/Time   NA 139 10/18/2015 0958   K 4.1 10/18/2015 0958   CL 102 10/18/2015 0958   CO2 31 10/18/2015 0958   GLUCOSE 92 10/18/2015 0958   BUN 16 10/18/2015 0958   CREATININE 0.89 10/18/2015 0958   CALCIUM 10.0 10/18/2015 0958   PROT 7.1 10/18/2015 0958   ALBUMIN 4.8 10/18/2015 0958   AST 36 10/18/2015 0958   ALT 65 (H) 10/18/2015 0958   ALKPHOS 70 10/18/2015 0958   BILITOT 1.7 (H) 10/18/2015 0958   No results found for: CHOL, HDL, LDLCALC, LDLDIRECT, TRIG, CHOLHDL No results found for: HGBA1C No results found for: VITAMINB12 No results found for: TSH     ASSESSMENT AND PLAN 39 y.o. year old male  has a past medical history of Bursitis of right knee, Chicken pox, HLD (hyperlipidemia), and Lipoma. here with     ICD-10-CM   1. OSA on CPAP  G47.33    Z99.89     Mr. Brouillette is doing well with CPAP therapy at home.  He does note improvement in sleep quality and feels better rested in the mornings.  He does continue to have difficulty with interrupted sleep due to his career and having small children at home.  Compliance report reveals optimal daily usage and 4-hour usage at 67%.  He was encouraged to continue using CPAP nightly and for greater than 4 hours each night.  Risk of untreated sleep apnea reviewed.  He will work on healthy lifestyle habits.  He will follow-up with me in 3 months, sooner if needed.  He verbalizes understanding and agreement with  this plan.   No orders of the defined types were placed in this encounter.    No orders of the defined types were placed in this encounter.     I spent 20 minutes with the patient. 50% of this time was spent counseling and educating patient on plan of care and medications.    Debbora Presto, FNP-C 11/24/2019, 8:10 AM [facility] [address] [place], [place] [address] [phone]

## 2019-11-23 NOTE — Telephone Encounter (Signed)
Spoke to pt, unable to pull cpap data, pt will bring machine

## 2019-11-23 NOTE — Patient Instructions (Addendum)
Please continue using your CPAP regularly. While your insurance requires that you use CPAP at least 4 hours each night on 70% of the nights, I recommend, that you not skip any nights and use it throughout the night if you can. Getting used to CPAP and staying with the treatment long term does take time and patience and discipline. Untreated obstructive sleep apnea when it is moderate to severe can have an adverse impact on cardiovascular health and raise her risk for heart disease, arrhythmias, hypertension, congestive heart failure, stroke and diabetes. Untreated obstructive sleep apnea causes sleep disruption, nonrestorative sleep, and sleep deprivation. This can have an impact on your day to day functioning and cause daytime sleepiness and impairment of cognitive function, memory loss, mood disturbance, and problems focussing. Using CPAP regularly can improve these symptoms.   Follow up in 3 months   Sleep Apnea Sleep apnea affects breathing during sleep. It causes breathing to stop for a short time or to become shallow. It can also increase the risk of:  Heart attack.  Stroke.  Being very overweight (obese).  Diabetes.  Heart failure.  Irregular heartbeat. The goal of treatment is to help you breathe normally again. What are the causes? There are three kinds of sleep apnea:  Obstructive sleep apnea. This is caused by a blocked or collapsed airway.  Central sleep apnea. This happens when the brain does not send the right signals to the muscles that control breathing.  Mixed sleep apnea. This is a combination of obstructive and central sleep apnea. The most common cause of this condition is a collapsed or blocked airway. This can happen if:  Your throat muscles are too relaxed.  Your tongue and tonsils are too large.  You are overweight.  Your airway is too small. What increases the risk?  Being overweight.  Smoking.  Having a small airway.  Being older.  Being  male.  Drinking alcohol.  Taking medicines to calm yourself (sedatives or tranquilizers).  Having family members with the condition. What are the signs or symptoms?  Trouble staying asleep.  Being sleepy or tired during the day.  Getting angry a lot.  Loud snoring.  Headaches in the morning.  Not being able to focus your mind (concentrate).  Forgetting things.  Less interest in sex.  Mood swings.  Personality changes.  Feelings of sadness (depression).  Waking up a lot during the night to pee (urinate).  Dry mouth.  Sore throat. How is this diagnosed?  Your medical history.  A physical exam.  A test that is done when you are sleeping (sleep study). The test is most often done in a sleep lab but may also be done at home. How is this treated?   Sleeping on your side.  Using a medicine to get rid of mucus in your nose (decongestant).  Avoiding the use of alcohol, medicines to help you relax, or certain pain medicines (narcotics).  Losing weight, if needed.  Changing your diet.  Not smoking.  Using a machine to open your airway while you sleep, such as: ? An oral appliance. This is a mouthpiece that shifts your lower jaw forward. ? A CPAP device. This device blows air through a mask when you breathe out (exhale). ? An EPAP device. This has valves that you put in each nostril. ? A BPAP device. This device blows air through a mask when you breathe in (inhale) and breathe out.  Having surgery if other treatments do not work. It is   important to get treatment for sleep apnea. Without treatment, it can lead to:  High blood pressure.  Coronary artery disease.  In men, not being able to have an erection (impotence).  Reduced thinking ability. Follow these instructions at home: Lifestyle  Make changes that your doctor recommends.  Eat a healthy diet.  Lose weight if needed.  Avoid alcohol, medicines to help you relax, and some pain  medicines.  Do not use any products that contain nicotine or tobacco, such as cigarettes, e-cigarettes, and chewing tobacco. If you need help quitting, ask your doctor. General instructions  Take over-the-counter and prescription medicines only as told by your doctor.  If you were given a machine to use while you sleep, use it only as told by your doctor.  If you are having surgery, make sure to tell your doctor you have sleep apnea. You may need to bring your device with you.  Keep all follow-up visits as told by your doctor. This is important. Contact a doctor if:  The machine that you were given to use during sleep bothers you or does not seem to be working.  You do not get better.  You get worse. Get help right away if:  Your chest hurts.  You have trouble breathing in enough air.  You have an uncomfortable feeling in your back, arms, or stomach.  You have trouble talking.  One side of your body feels weak.  A part of your face is hanging down. These symptoms may be an emergency. Do not wait to see if the symptoms will go away. Get medical help right away. Call your local emergency services (911 in the [place] Do not drive yourself to the hospital. Summary  This condition affects breathing during sleep.  The most common cause is a collapsed or blocked airway.  The goal of treatment is to help you breathe normally while you sleep. This information is not intended to replace advice given to you by your health care provider. Make sure you discuss any questions you have with your health care provider. Document Revised: 04/24/2018 Document Reviewed: 03/03/2018 Elsevier Patient Education  2020 Elsevier Inc.  

## 2019-11-24 ENCOUNTER — Encounter: Payer: Self-pay | Admitting: Family Medicine

## 2019-11-24 ENCOUNTER — Ambulatory Visit: Payer: 59 | Admitting: Family Medicine

## 2019-11-24 ENCOUNTER — Other Ambulatory Visit: Payer: Self-pay

## 2019-11-24 VITALS — BP 123/74 | HR 74 | Temp 97.1°F | Ht 74.0 in | Wt 242.0 lb

## 2019-11-24 DIAGNOSIS — G4733 Obstructive sleep apnea (adult) (pediatric): Secondary | ICD-10-CM

## 2019-11-24 DIAGNOSIS — Z9989 Dependence on other enabling machines and devices: Secondary | ICD-10-CM | POA: Diagnosis not present

## 2020-02-22 ENCOUNTER — Telehealth: Payer: 59 | Admitting: Family Medicine

## 2020-03-28 ENCOUNTER — Ambulatory Visit: Payer: 59 | Admitting: Neurology

## 2020-05-25 ENCOUNTER — Ambulatory Visit: Payer: 59 | Admitting: Neurology

## 2024-02-23 ENCOUNTER — Other Ambulatory Visit (HOSPITAL_BASED_OUTPATIENT_CLINIC_OR_DEPARTMENT_OTHER): Payer: Self-pay | Admitting: Family Medicine

## 2024-02-23 DIAGNOSIS — Z8249 Family history of ischemic heart disease and other diseases of the circulatory system: Secondary | ICD-10-CM

## 2024-03-04 ENCOUNTER — Ambulatory Visit (HOSPITAL_BASED_OUTPATIENT_CLINIC_OR_DEPARTMENT_OTHER)
Admission: RE | Admit: 2024-03-04 | Discharge: 2024-03-04 | Disposition: A | Discharge status: Home / Self Care | Payer: Self-pay | Source: Ambulatory Visit | Attending: Family Medicine | Admitting: Family Medicine

## 2024-03-04 DIAGNOSIS — Z8249 Family history of ischemic heart disease and other diseases of the circulatory system: Secondary | ICD-10-CM

## 2024-06-14 ENCOUNTER — Telehealth: Payer: Self-pay | Admitting: *Deleted

## 2024-06-14 NOTE — Telephone Encounter (Signed)
 Call from [facility] in [place] requesting a urgent referral appointment for this patient to R/O hemachromatosis. Current ferritin is 1679 and 1 month ago was 1456. Confirmed that patient's contact information was correct and provided fax number to send records. Notified nurse navigator.

## 2024-07-05 ENCOUNTER — Encounter: Payer: Self-pay | Admitting: Oncology

## 2024-07-05 ENCOUNTER — Inpatient Hospital Stay

## 2024-07-05 ENCOUNTER — Inpatient Hospital Stay: Attending: Oncology | Admitting: Oncology

## 2024-07-05 VITALS — BP 143/87 | HR 80 | Temp 98.2°F | Resp 16 | Wt 217.9 lb

## 2024-07-05 DIAGNOSIS — R748 Abnormal levels of other serum enzymes: Secondary | ICD-10-CM | POA: Insufficient documentation

## 2024-07-05 DIAGNOSIS — R7989 Other specified abnormal findings of blood chemistry: Secondary | ICD-10-CM | POA: Insufficient documentation

## 2024-07-05 LAB — CBC WITH DIFFERENTIAL (CANCER CENTER ONLY)
Abs Immature Granulocytes: 0.02 K/uL (ref 0.00–0.07)
Basophils Absolute: 0 K/uL (ref 0.0–0.1)
Basophils Relative: 1 %
Eosinophils Absolute: 0.1 K/uL (ref 0.0–0.5)
Eosinophils Relative: 1 %
HCT: 44 % (ref 39.0–52.0)
Hemoglobin: 15.6 g/dL (ref 13.0–17.0)
Immature Granulocytes: 0 %
Lymphocytes Relative: 23 %
Lymphs Abs: 1.4 K/uL (ref 0.7–4.0)
MCH: 32 pg (ref 26.0–34.0)
MCHC: 35.5 g/dL (ref 30.0–36.0)
MCV: 90.2 fL (ref 80.0–100.0)
Monocytes Absolute: 0.6 K/uL (ref 0.1–1.0)
Monocytes Relative: 10 %
Neutro Abs: 3.9 K/uL (ref 1.7–7.7)
Neutrophils Relative %: 65 %
Platelet Count: 217 K/uL (ref 150–400)
RBC: 4.88 MIL/uL (ref 4.22–5.81)
RDW: 12.5 % (ref 11.5–15.5)
WBC Count: 6 K/uL (ref 4.0–10.5)
nRBC: 0 % (ref 0.0–0.2)

## 2024-07-05 LAB — IRON AND TIBC
Iron: 218 ug/dL — ABNORMAL HIGH (ref 45–182)
Saturation Ratios: 80 % — ABNORMAL HIGH (ref 17.9–39.5)
TIBC: 273 ug/dL (ref 250–450)
UIBC: 55 ug/dL

## 2024-07-05 LAB — CMP (CANCER CENTER ONLY)
ALT: 86 U/L — ABNORMAL HIGH (ref 0–44)
AST: 43 U/L — ABNORMAL HIGH (ref 15–41)
Albumin: 4.7 g/dL (ref 3.5–5.0)
Alkaline Phosphatase: 79 U/L (ref 38–126)
Anion gap: 10 (ref 5–15)
BUN: 15 mg/dL (ref 6–20)
CO2: 29 mmol/L (ref 22–32)
Calcium: 10.6 mg/dL — ABNORMAL HIGH (ref 8.9–10.3)
Chloride: 102 mmol/L (ref 98–111)
Creatinine: 0.84 mg/dL (ref 0.61–1.24)
GFR, Estimated: >60 mL/min (ref >60)
Glucose, Bld: 98 mg/dL (ref 70–99)
Potassium: 4.9 mmol/L (ref 3.5–5.1)
Sodium: 140 mmol/L (ref 135–145)
Total Bilirubin: 1.2 mg/dL (ref 0.0–1.2)
Total Protein: 7.2 g/dL (ref 6.5–8.1)

## 2024-07-05 LAB — FERRITIN: Ferritin: 1675 ng/mL — ABNORMAL HIGH (ref 24–336)

## 2024-07-05 NOTE — Progress Notes (Signed)
 [facility]  HEMATOLOGY CLINIC CONSULTATION NOTE   PATIENT NAME: Brendan Brady   MR#: [id number] DOB: 07/09/1981  DATE OF SERVICE: 07/05/2024   REFERRING PROVIDER Brendan Bullock, PA-C  Patient Care Team: Brendan Brady DELENA DEVONNA as PCP - General (General Practice)   REASON FOR CONSULTATION/ CHIEF COMPLAINT:  Elevated ferritin and iron saturation, concerning for hemochromatosis  ASSESSMENT & PLAN:  Brendan Brady is a 43 y.o. gentleman with a past medical history of hypothyroidism, hypogonadism, was referred to our service for evaluation of elevated ferritin and iron saturation, concerning for hemochromatosis.    Elevated ferritin On 05/06/2024, labs showed increased iron saturation of 93%, ferritin increased at 1456 ng/mL.  Hemoglobin was 19.5, hematocrit 57.2.  White count and platelet count were within normal limits.  CMP unremarkable.  Testosterone level was increased at 924, presumed to be from testosterone supplementation.  Repeat labs on 06/09/2024 showed iron saturation of 94%, ferritin increased at 1679 ng/mL.  Hemoglobin was 17.4, hematocrit 52.2.  White count and platelet count were within normal limits.  Given elevated ferritin and iron saturation, with concern for hemochromatosis, referral was sent to us  for further evaluation.  Strong clinical suspicion for hereditary hemochromatosis based on persistently elevated ferritin and iron saturation.   Genetic confirmation is pending.   The condition may be de novo or inherited, with risk of iron deposition and organ damage. He is anxious but motivated for management. Therapeutic phlebotomy is preferred initial therapy; iron chelation reserved for intolerance or inadequate response. Dietary iron intake and infection risk from raw seafood are relevant. Testosterone replacement therapy is not contraindicated but requires monitoring.  -Labs today show unremarkable CBCD.  AST 43, ALT 86, total bilirubin normal at  1.2.  Ferritin remains elevated at 1675 ng/mL.  Remaining iron studies are pending.  - Ordered genetic testing for C282Y, H63D, and S65C mutations to confirm diagnosis.  - Planned phone follow-up next week to discuss results and management.  - Discussed initiation of therapeutic phlebotomy targeting ferritin <50 ng/mL and iron saturation <50%, with frequency adjusted based on hemoglobin and tolerance (initially every other week, then monthly, then every three months for maintenance).  - Discussed iron chelation therapy as alternative if phlebotomy is insufficient or not tolerated, including potential side effects.  - Provided dietary guidance: moderate iron intake from cast iron cookware and avoid raw seafood/shellfish due to increased risk of Vibrio vulnificus infection.  - Discussed continued testosterone replacement therapy, noting no contraindication but need for monitoring PSA and red cell count.  - Planned echocardiogram and abdominal ultrasound to assess cardiac and liver function if diagnosis confirmed.  - Arrange for phlebotomy in clinic if indicated.  Abnormal liver enzymes Intermittently elevated liver enzymes, likely secondary to iron overload from suspected hereditary hemochromatosis. Liver enzymes have fluctuated with weight loss and dietary changes. - Planned abdominal ultrasound to assess liver texture and establish baseline if hereditary hemochromatosis is confirmed. - Provided anticipatory guidance regarding importance of healthy diet and ongoing liver function monitoring.   I reviewed lab results and outside records for this visit and discussed relevant results with the patient. Diagnosis, plan of care and treatment options were also discussed in detail with the patient. Opportunity provided to ask questions and answers provided to his apparent satisfaction. Provided instructions to call our clinic with any problems, questions or concerns prior to return visit. I  recommended to continue follow-up with PCP and sub-specialists. He verbalized understanding and agreed with the plan. No barriers to  learning was detected.  Brendan Patten, MD  07/05/2024 4:17 PM  [facility] - A DEPT OF [facility] [address] [place] [address] Dept: [phone] Dept Fax: [phone]   HISTORY OF PRESENT ILLNESS:  Discussed the use of AI scribe software for clinical note transcription with the patient, who gave verbal consent to proceed.  History of Present Illness Brendan Brady is a 43 year old male referred for evaluation of persistent hyperferritinemia and abnormal liver enzymes.  On 05/06/2024, labs showed increased iron saturation of 93%, ferritin increased at 1456 ng/mL.  Hemoglobin was 19.5, hematocrit 57.2.  White count and platelet count were within normal limits.  CMP unremarkable.  Testosterone level was increased at 924, presumed to be from testosterone supplementation.  Repeat labs on 06/09/2024 showed iron saturation of 94%, ferritin increased at 1679 ng/mL.  Hemoglobin was 17.4, hematocrit 52.2.  White count and platelet count were within normal limits.  Given elevated ferritin and iron saturation, with concern for hemochromatosis, referral was sent to us  for further evaluation.  He has had persistently elevated ferritin levels for approximately two years, initially identified during routine laboratory testing. Recent ferritin values were 1,667 and 779 ng/mL, with iron saturation reaching 94%. He has undergone phlebotomy every two to three weeks for three sessions, resulting in a downward trend in ferritin, but hyperferritinemia persists. He expresses significant anxiety regarding potential organ damage from iron overload and is motivated to manage his condition.  Liver enzymes have been intermittently elevated over several years, with ALT as high as 68 U/L and occasional mild  hyperbilirubinemia. He notes that weight loss and dietary modifications have helped normalize his liver enzymes, but minor dietary lapses can cause elevations.  He has been on testosterone replacement therapy for two years to maintain normal testosterone levels and improve sleep. He notes fluctuations in red blood cell count and testosterone levels, and is aware of the need for ongoing PSA monitoring. He previously used CPAP for sleep apnea, which he discontinued after weight loss with improved sleep quality.  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 He denies consumption of shellfish or raw seafood and expresses concern about infection risk and dietary management in the context of suspected hemochromatosis.  He is employed as a landscape architect. He recently underwent a coronary calcium score, which was reportedly normal. He is anxious about his abnormal laboratory results and the possibility of hemochromatosis.  He denies fever, cough, diarrhea, or other infectious symptoms.  He denies epistaxis, bloody stool, melena, hematuria, bruising or other bleeding symptoms. He also denies unintentional weight loss, night sweats or other constitutional symptoms.  MEDICAL HISTORY Past Medical History:  Diagnosis Date   Bursitis of right knee    Chicken pox    HLD (hyperlipidemia)    Lipoma      SURGICAL HISTORY Past Surgical History:  Procedure Laterality Date   CYST EXCISION  1998   VASECTOMY  2018     SOCIAL HISTORY: He reports that he has never smoked. He has never used smokeless tobacco. He reports current alcohol use. He reports that he does not use drugs. Social History   Socioeconomic History   Marital status: Married    Spouse name: Not on file   Number of children: Not on file   Years of education:  Not on file   Highest  education level: Not on file  Occupational History   Not on file  Tobacco Use   Smoking status: Never   Smokeless tobacco: Never  Substance and Sexual Activity   Alcohol use: Yes    Comment: Social   Drug use: No   Sexual activity: Not on file  Other Topics Concern   Not on file  Social History Narrative   Not on file   Social Drivers of Health   Tobacco Use: Not on file  Financial Resource Strain: Not on file  Food Insecurity: No Food Insecurity (07/05/2024)   Epic    Worried About Running Out of Food in the Last Year: Never true    Ran Out of Food in the Last Year: Never true  Transportation Needs: No Transportation Needs (07/05/2024)   Epic    Lack of Transportation (Medical): No    Lack of Transportation (Non-Medical): No  Physical Activity: Not on file  Stress: Not on file  Social Connections: Not on file  Intimate Partner Violence: Not At Risk (07/05/2024)   Epic    Fear of Current or Ex-Partner: No    Emotionally Abused: No    Physically Abused: No    Sexually Abused: No  Depression (PHQ2-9): Low Risk (07/05/2024)   Depression (PHQ2-9)    PHQ-2 Score: 0  Alcohol Screen: Not on file  Housing: Low Risk (07/05/2024)   Epic    Unable to Pay for Housing in the Last Year: No    Number of Times Moved in the Last Year: 0    Homeless in the Last Year: No  Utilities: Not At Risk (07/05/2024)   Epic    Threatened with loss of utilities: No  Health Literacy: Not on file    FAMILY HISTORY: His family history includes Arthritis in his father; Cancer in his mother.  CURRENT MEDICATIONS   Current Outpatient Medications  Medication Instructions   ibuprofen (ADVIL) 200 mg, Every 6 hours PRN   levothyroxine (SYNTHROID) 137 mcg, Daily before breakfast     ALLERGIES  He is allergic to latex.  REVIEW OF SYSTEMS:  Review of Systems - Oncology   Rest of the pertinent review of systems is unremarkable except as mentioned above in HPI.  PHYSICAL EXAMINATION:    Onc Performance Status - 07/05/24 1330       ECOG Perf Status   ECOG Perf Status Fully active, able to carry on all pre-disease performance without restriction      KPS SCALE   KPS % SCORE Normal, no compliants, no evidence of disease          Vitals:   07/05/24 1316 07/05/24 1317  BP: (!) 148/91 (!) 143/87  Pulse: 80   Resp: 16   Temp: 98.2 F (36.8 C)   SpO2: 98%    Filed Weights   07/05/24 1316  Weight: 217 lb 14.4 oz (98.8 kg)    Physical Exam Constitutional:      General: He is not in acute distress.    Appearance: Normal appearance.  HENT:     Head: Normocephalic and atraumatic.  Eyes:     Conjunctiva/sclera: Conjunctivae normal.  Cardiovascular:     Rate and Rhythm: Normal rate and regular rhythm.     Heart sounds: Normal heart sounds.  Pulmonary:     Effort: Pulmonary effort is normal. No respiratory distress.     Breath sounds: Normal breath sounds.  Abdominal:     General: There  is no distension.  Neurological:     General: No focal deficit present.     Mental Status: He is alert and oriented to person, place, and time.  Psychiatric:        Mood and Affect: Mood normal.        Behavior: Behavior normal.      LABORATORY DATA:   I have reviewed the data as listed.  Results for orders placed or performed in visit on 07/05/24  Ferritin  Result Value Ref Range   Ferritin 1,675 (H) 24 - 336 ng/mL  CMP (Cancer Center only)  Result Value Ref Range   Sodium 140 135 - 145 mmol/L   Potassium 4.9 3.5 - 5.1 mmol/L   Chloride 102 98 - 111 mmol/L   CO2 29 22 - 32 mmol/L   Glucose, Bld 98 70 - 99 mg/dL   BUN 15 6 - 20 mg/dL   Creatinine 9.15 9.38 - 1.24 mg/dL   Calcium 89.3 (H) 8.9 - 10.3 mg/dL   Total Protein 7.2 6.5 - 8.1 g/dL   Albumin 4.7 3.5 - 5.0 g/dL   AST 43 (H) 15 - 41 U/L   ALT 86 (H) 0 - 44 U/L   Alkaline Phosphatase 79 38 - 126 U/L   Total Bilirubin 1.2 0.0 - 1.2 mg/dL   GFR, Estimated >39 >39 mL/min   Anion gap 10 5 - 15  CBC with  Differential (Cancer Center Only)  Result Value Ref Range   WBC Count 6.0 4.0 - 10.5 K/uL   RBC 4.88 4.22 - 5.81 MIL/uL   Hemoglobin 15.6 13.0 - 17.0 g/dL   HCT 55.9 60.9 - 47.9 %   MCV 90.2 80.0 - 100.0 fL   MCH 32.0 26.0 - 34.0 pg   MCHC 35.5 30.0 - 36.0 g/dL   RDW 87.4 88.4 - 84.4 %   Platelet Count 217 150 - 400 K/uL   nRBC 0.0 0.0 - 0.2 %   Neutrophils Relative % 65 %   Neutro Abs 3.9 1.7 - 7.7 K/uL   Lymphocytes Relative 23 %   Lymphs Abs 1.4 0.7 - 4.0 K/uL   Monocytes Relative 10 %   Monocytes Absolute 0.6 0.1 - 1.0 K/uL   Eosinophils Relative 1 %   Eosinophils Absolute 0.1 0.0 - 0.5 K/uL   Basophils Relative 1 %   Basophils Absolute 0.0 0.0 - 0.1 K/uL   Immature Granulocytes 0 %   Abs Immature Granulocytes 0.02 0.00 - 0.07 K/uL     RADIOGRAPHIC STUDIES:  I have personally reviewed the radiological images as listed and agreed with the findings in the report.  CT CARDIAC SCORING (SELF PAY ONLY) Addendum: ADDENDUM REPORT: 03/07/2024 12:42   EXAM:  OVER-READ INTERPRETATION  CT CHEST   The following report is an over-read performed by radiologist Dr.  Fonda Mom [facility] on 03/07/2024. This  over-read does not include interpretation of cardiac or coronary  anatomy or pathology. The coronary CTA interpretation by the  cardiologist is attached.   COMPARISON:  None.   FINDINGS:  Cardiovascular:  See findings discussed in the body of the report.   Mediastinum/Nodes: No suspicious adenopathy identified. Imaged  mediastinal structures are unremarkable.   Lungs/Pleura: Imaged lungs are clear. No pleural effusion or  pneumothorax.   Upper Abdomen: No acute abnormality.   Musculoskeletal: Postsurgical changes with chronic appearing  sclerosis and widening of mid thoracic medial posterior right rib.  No acute osseous abnormalities.   IMPRESSION:  No  acute extracardiac incidental findings.   Electronically Signed    By: Fonda Field  M.D.    On: 03/07/2024 12:42 Narrative: : CLINICAL DATA:  Cardiovascular Disease Risk stratification  EXAM:  Coronary Calcium Score  TECHNIQUE:  A gated, non-contrast computed tomography scan of the heart was  performed using 3mm slice thickness. Axial images were analyzed on a  dedicated workstation. Calcium scoring of the coronary arteries was  performed using the Agatston method.  FINDINGS:  Coronary Calcium Score:  Left main: 0  Left anterior descending artery: 0  Left circumflex artery: 0  Right coronary artery: 0  Total: 0  Pericardium: Normal.  Ascending Aorta: Normal caliber.  Pulmonary artery: Normal caliber  Non-cardiac: See separate report from [facility].  IMPRESSION:  Coronary calcium score of 0.  RECOMMENDATIONS:  Coronary artery calcium (CAC) score is a strong predictor of  incident coronary heart disease (CHD) and provides predictive  information beyond traditional risk factors. CAC scoring is  reasonable to use in the decision to withhold, postpone, or initiate  statin therapy in intermediate-risk or selected borderline-risk  asymptomatic adults (age 22-75 years and LDL-C >=70 to <190 mg/dL)  who do not have diabetes or established atherosclerotic  cardiovascular disease (ASCVD).* In intermediate-risk (10-year ASCVD  risk >=7.5% to <20%) adults or selected borderline-risk (10-year  ASCVD risk >=5% to <7.5%) adults in whom a CAC score is measured for  the purpose of making a treatment decision the following  recommendations have been made:  If CAC=0, it is reasonable to withhold statin therapy and reassess  in 5 to 10 years, as long as higher risk conditions are absent  (diabetes mellitus, family history of premature CHD in first degree  relatives (males <55 years; females <65 years), cigarette smoking,  or LDL >=190 mg/dL).  If CAC is 1 to 99, it is reasonable to initiate statin therapy for  patients >=32  years of age.  If CAC is >=100 or >=75th percentile, it is reasonable to initiate  statin therapy at any age.  Cardiology referral should be considered for patients with CAC  scores >=400 or >=75th percentile.  *2018 AHA/ACC/AACVPR/AAPA/ABC/ACPM/ADA/AGS/APhA/ASPC/NLA/PCNA  Guideline on the Management of Blood Cholesterol: A Report of the  American College of Cardiology/American Heart Association Task Force  on Clinical Practice Guidelines. J Am Coll Cardiol.  2019;73(24):3168-3209.  Electronically Signed: By: Lamar Fitch M.D. On: 03/04/2024 12:49   Orders Placed This Encounter  Procedures   CBC with Differential (Cancer Center Only)    Standing Status:   Future    Number of Occurrences:   1    Expiration Date:   07/05/2025   CMP (Cancer Center only)    Standing Status:   Future    Number of Occurrences:   1    Expiration Date:   07/05/2025   Iron and TIBC    Standing Status:   Future    Number of Occurrences:   1    Expiration Date:   07/05/2025   Ferritin    Standing Status:   Future    Number of Occurrences:   1    Expiration Date:   07/05/2025   Hemochromatosis DNA, PCR    Standing Status:   Future    Number of Occurrences:   1    Expiration Date:   07/05/2025    Future Appointments  Date Time Provider Department Center  07/13/2024  1:30 PM Natalya Domzalski, [place], MD [facility] None    I spent a total  of 55 minutes during this encounter with the patient including review of chart and various tests results, discussions about plan of care and coordination of care plan.  This document was completed utilizing speech recognition software. Grammatical errors, random word insertions, pronoun errors, and incomplete sentences are an occasional consequence of this system due to software limitations, ambient noise, and hardware issues. Any formal questions or concerns about the content, text or information contained within the body of this dictation should be directly  addressed to the provider for clarification.

## 2024-07-05 NOTE — Assessment & Plan Note (Signed)
 Intermittently elevated liver enzymes, likely secondary to iron overload from suspected hereditary hemochromatosis. Liver enzymes have fluctuated with weight loss and dietary changes. - Planned abdominal ultrasound to assess liver texture and establish baseline if hereditary hemochromatosis is confirmed. - Provided anticipatory guidance regarding importance of healthy diet and ongoing liver function monitoring.

## 2024-07-05 NOTE — Assessment & Plan Note (Addendum)
 On 05/06/2024, labs showed increased iron saturation of 93%, ferritin increased at 1456 ng/mL.  Hemoglobin was 19.5, hematocrit 57.2.  White count and platelet count were within normal limits.  CMP unremarkable.  Testosterone level was increased at 924, presumed to be from testosterone supplementation.  Repeat labs on 06/09/2024 showed iron saturation of 94%, ferritin increased at 1679 ng/mL.  Hemoglobin was 17.4, hematocrit 52.2.  White count and platelet count were within normal limits.  Given elevated ferritin and iron saturation, with concern for hemochromatosis, referral was sent to us  for further evaluation.  Strong clinical suspicion for hereditary hemochromatosis based on persistently elevated ferritin and iron saturation.   Genetic confirmation is pending.   The condition may be de novo or inherited, with risk of iron deposition and organ damage. He is anxious but motivated for management. Therapeutic phlebotomy is preferred initial therapy; iron chelation reserved for intolerance or inadequate response. Dietary iron intake and infection risk from raw seafood are relevant. Testosterone replacement therapy is not contraindicated but requires monitoring.  -Labs today show unremarkable CBCD.  AST 43, ALT 86, total bilirubin normal at 1.2.  Ferritin remains elevated at 1675 ng/mL.  Remaining iron studies are pending.  - Ordered genetic testing for C282Y, H63D, and S65C mutations to confirm diagnosis.  - Planned phone follow-up next week to discuss results and management.  - Discussed initiation of therapeutic phlebotomy targeting ferritin <50 ng/mL and iron saturation <50%, with frequency adjusted based on hemoglobin and tolerance (initially every other week, then monthly, then every three months for maintenance).  - Discussed iron chelation therapy as alternative if phlebotomy is insufficient or not tolerated, including potential side effects.  - Provided dietary guidance: moderate iron  intake from cast iron cookware and avoid raw seafood/shellfish due to increased risk of Vibrio vulnificus infection.  - Discussed continued testosterone replacement therapy, noting no contraindication but need for monitoring PSA and red cell count.  - Planned echocardiogram and abdominal ultrasound to assess cardiac and liver function if diagnosis confirmed.  - Arrange for phlebotomy in clinic if indicated.

## 2024-07-09 LAB — HEMOCHROMATOSIS DNA-PCR(C282Y,H63D)

## 2024-07-13 ENCOUNTER — Encounter: Payer: Self-pay | Admitting: Oncology

## 2024-07-13 ENCOUNTER — Inpatient Hospital Stay: Admitting: Oncology

## 2024-07-13 DIAGNOSIS — R748 Abnormal levels of other serum enzymes: Secondary | ICD-10-CM | POA: Diagnosis not present

## 2024-07-13 NOTE — Assessment & Plan Note (Signed)
 On 05/06/2024, labs showed increased iron saturation of 93%, ferritin increased at 1456 ng/mL.  Hemoglobin was 19.5, hematocrit 57.2.  White count and platelet count were within normal limits.  CMP unremarkable.  Testosterone level was increased at 924, presumed to be from testosterone supplementation.  Repeat labs on 06/09/2024 showed iron saturation of 94%, ferritin increased at 1679 ng/mL.  Hemoglobin was 17.4, hematocrit 52.2.  White count and platelet count were within normal limits.  Given elevated ferritin and iron saturation, with concern for hemochromatosis, referral was sent to us  for further evaluation.  Strong clinical suspicion for hereditary hemochromatosis based on persistently elevated ferritin and iron saturation.   Genetic confirmation is pending.   The condition may be de novo or inherited, with risk of iron deposition and organ damage. He is anxious but motivated for management. Therapeutic phlebotomy is preferred initial therapy; iron chelation reserved for intolerance or inadequate response. Dietary iron intake and infection risk from raw seafood are relevant. Testosterone replacement therapy is not contraindicated but requires monitoring.  On his consultation with us  on 07/05/2024, labs showed unremarkable CBCD.  On CMP, ALT was increased at 86, AST slightly increased at 43, alkaline phosphatase and bilirubin were within normal limits.  Otherwise unremarkable CMP.  Iron studies showed increased iron saturation of 80%.  Ferritin was increased at 1675 ng/mL.  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  There was no evidence of H63D or S65C mutations.  We will arrange therapeutic phlebotomies with aim to keep ferritin closer to 50 and below and iron saturation below 50%, with frequency adjusted based on hemoglobin and tolerance (initially every other week, then monthly, then every three months for maintenance).    Request submitted for MRI of the liver  and echocardiogram to look for any evidence of endorgan damage.  First therapeutic phlebotomy scheduled on 07/20/2024.  - Discussed iron chelation therapy as alternative if phlebotomy is insufficient or not tolerated, including potential side effects.  - Provided dietary guidance: moderate iron intake from cast iron cookware and avoid raw seafood/shellfish due to increased risk of Vibrio vulnificus infection.  - Discussed continued testosterone replacement therapy, noting no contraindication but need for monitoring PSA and red cell count.  He will come for labs, therapeutic phlebotomy every 2 weeks starting from 07/20/2024.  I will plan to see him when he is due for 3rd phlebotomy.

## 2024-07-13 NOTE — Assessment & Plan Note (Signed)
 Mildly elevated ALT and AST, with increasing ALT trend, likely secondary to iron overload from hereditary hemochromatosis. - Ordered baseline liver MRI to evaluate hepatic iron deposition and liver status.

## 2024-07-13 NOTE — Progress Notes (Signed)
 "  [facility]  HEMATOLOGY-ONCOLOGY ELECTRONIC VISIT PROGRESS NOTE  PATIENT NAME: Brendan Brady   MR#: [id number] DOB: [birth year]  DATE OF SERVICE: 07/13/2024  Patient Care Team: Brendan Brady as PCP - General (General Practice)  I connected with the patient via telephone conference and verified that I am speaking with the correct person using two identifiers. The patient's location is at home and I am providing care from the [facility]  I discussed the limitations, risks, security and privacy concerns of performing an evaluation and management service by e-visits and the availability of in person appointments. I also discussed with the patient that there may be a patient responsible charge related to this service. The patient expressed understanding and agreed to proceed.   ASSESSMENT & PLAN:   Brendan Brady is a 43 y.o.  gentleman with a past medical history of hypothyroidism, hypogonadism, was referred to our service for evaluation of elevated ferritin and iron saturation, concerning for hemochromatosis.    Hereditary hemochromatosis On 05/06/2024, labs showed increased iron saturation of 93%, ferritin increased at 1456 ng/mL.  Hemoglobin was 19.5, hematocrit 57.2.  White count and platelet count were within normal limits.  CMP unremarkable.  Testosterone level was increased at 924, presumed to be from testosterone supplementation.  Repeat labs on 06/09/2024 showed iron saturation of 94%, ferritin increased at 1679 ng/mL.  Hemoglobin was 17.4, hematocrit 52.2.  White count and platelet count were within normal limits.  Given elevated ferritin and iron saturation, with concern for hemochromatosis, referral was sent to us  for further evaluation.  Strong clinical suspicion for hereditary hemochromatosis based on persistently elevated ferritin and iron saturation.   Genetic confirmation is pending.   The condition may be de novo or inherited, with risk of iron  deposition and organ damage. He is anxious but motivated for management. Therapeutic phlebotomy is preferred initial therapy; iron chelation reserved for intolerance or inadequate response. Dietary iron intake and infection risk from raw seafood are relevant. Testosterone replacement therapy is not contraindicated but requires monitoring.  On his consultation with us  on 07/05/2024, labs showed unremarkable CBCD.  On CMP, ALT was increased at 86, AST slightly increased at 43, alkaline phosphatase and bilirubin were within normal limits.  Otherwise unremarkable CMP.  Iron studies showed increased iron saturation of 80%.  Ferritin was increased at 1675 ng/mL.  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  There was no evidence of H63D or S65C mutations.  We will arrange therapeutic phlebotomies with aim to keep ferritin closer to 50 and below and iron saturation below 50%, with frequency adjusted based on hemoglobin and tolerance (initially every other week, then monthly, then every three months for maintenance).    Request submitted for MRI of the liver and echocardiogram to look for any evidence of endorgan damage.  First therapeutic phlebotomy scheduled on 07/20/2024.  - Discussed iron chelation therapy as alternative if phlebotomy is insufficient or not tolerated, including potential side effects.  - Provided dietary guidance: moderate iron intake from cast iron cookware and avoid raw seafood/shellfish due to increased risk of Vibrio vulnificus infection.  - Discussed continued testosterone replacement therapy, noting no contraindication but need for monitoring PSA and red cell count.  He will come for labs, therapeutic phlebotomy every 2 weeks starting from 07/20/2024.  I will plan to see him when he is due for 3rd phlebotomy.  Abnormal liver enzymes Mildly elevated ALT and AST, with increasing ALT trend, likely secondary to iron  overload from hereditary  hemochromatosis. - Ordered baseline liver MRI to evaluate hepatic iron deposition and liver status.   I discussed the assessment and treatment plan with the patient. The patient was provided an opportunity to ask questions and all were answered. The patient agreed with the plan and demonstrated an understanding of the instructions. The patient was advised to call back or seek an in-person evaluation if the symptoms worsen or if the condition fails to improve as anticipated.    I spent 23 minutes over the phone with the patient reviewing test results, discuss management and coordination/planning of care.  Chinita Patten, MD 07/13/2024 2:50 PM Brendan Brady [id number] [place] [address] Dept: [phone] Dept Fax: [phone]   INTERVAL HISTORY:  Please see above for problem oriented charting.  The purpose of today's discussion is to explain recent lab results and to formulate plan of care.  Discussed the use of AI scribe software for clinical note transcription with the patient, who gave verbal consent to proceed.  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  He remains asymptomatic with excellent energy and functional status, working two jobs and reporting no new symptoms since his last visit one week ago. He specifically notes feeling well and denies fatigue, abdominal pain, arthralgia, or other systemic complaints.  Laboratory studies continue to show elevated liver enzymes, with ALT increased to 86 from a previous value of 68, and ferritin persistently elevated at 1,675. He expresses concern regarding these abnormal values, particularly the rising liver enzymes, but denies symptoms suggestive of hepatic dysfunction.  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 He recently restarted low-dose testosterone replacement therapy and inquired about its safety in the context of phlebotomy, with no reported adverse effects.   SUMMARY OF HEMATOLOGY HISTORY:  On 05/06/2024, labs showed increased iron saturation of 93%, ferritin increased at 1456 ng/mL.  Hemoglobin was 19.5, hematocrit 57.2.  White count and platelet count were within normal limits.  CMP unremarkable.  Testosterone level was increased at 924, presumed to be from testosterone supplementation.   Repeat labs on 06/09/2024 showed iron saturation of 94%, ferritin increased at 1679 ng/mL.  Hemoglobin was 17.4, hematocrit 52.2.  White count and platelet count were within normal limits.   Given elevated ferritin and iron saturation, with concern for hemochromatosis, referral was sent to us  for further evaluation.   He has had persistently elevated ferritin levels for approximately two years, initially identified during routine laboratory testing. Recent ferritin values were 1,667 and 779 ng/mL, with iron saturation reaching 94%. He has undergone phlebotomy every two to three weeks for three sessions, resulting in a downward trend in ferritin, but hyperferritinemia persists. He expresses significant anxiety regarding potential organ damage from iron overload and is motivated to manage his condition.   Liver enzymes have been intermittently elevated over several years, with ALT as high as 68 U/L and occasional mild hyperbilirubinemia. He notes that weight loss and dietary modifications have helped normalize his liver enzymes, but minor dietary lapses can cause elevations.   He has been on testosterone replacement therapy for two years to maintain normal testosterone levels and improve sleep. He notes fluctuations in red blood cell count and testosterone levels, and is aware of the need for ongoing PSA monitoring. He previously used  CPAP for sleep apnea, which  he discontinued after weight loss with improved sleep quality.   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 He denies consumption of shellfish or raw seafood and expresses concern about infection risk and dietary management in the context of suspected hemochromatosis.   He is employed as a landscape architect. He recently underwent a coronary calcium score, which was reportedly normal. He is anxious about his abnormal laboratory results and the possibility of hemochromatosis.   On his consultation with us  on 07/05/2024, labs showed unremarkable CBCD.  On CMP, ALT was increased at 86, AST slightly increased at 43, alkaline phosphatase and bilirubin were within normal limits.  Otherwise unremarkable CMP.  Iron studies showed increased iron saturation of 80%.  Ferritin was increased at 1675 ng/mL.  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  There was no evidence of H63D or S65C mutations.  We will arrange therapeutic phlebotomies with aim to keep ferritin closer to 50 and below and iron saturation below 50%, with frequency adjusted based on hemoglobin and tolerance (initially every other week, then monthly, then every three months for maintenance).    Request submitted for MRI of the liver and echocardiogram to look for any evidence of endorgan damage.  First therapeutic phlebotomy scheduled on 07/20/2024.  - Discussed iron chelation therapy as alternative if phlebotomy is insufficient or not tolerated, including potential side effects.  - Provided dietary guidance: moderate iron intake from cast iron cookware and avoid raw seafood/shellfish due to increased risk of Vibrio vulnificus infection.  REVIEW OF SYSTEMS:    Review of Systems - Oncology  All other pertinent systems were reviewed with the patient and are  negative.  I have reviewed the past medical history, past surgical history, social history and family history with the patient and they are unchanged from previous note.  ALLERGIES:  He is allergic to latex.  MEDICATIONS:  Current Outpatient Medications  Medication Sig Dispense Refill   ibuprofen (ADVIL,MOTRIN) 200 MG tablet Take 200 mg by mouth every 6 (six) hours as needed for mild pain.     levothyroxine (SYNTHROID) 137 MCG tablet Take 137 mcg by mouth daily before breakfast.     No current facility-administered medications for this visit.    PHYSICAL EXAMINATION:  Not performed today as it was a phone only visit  LABORATORY DATA:   I have reviewed the data as listed.  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 Biochemical testing such as transferrin-iron saturation and/or serum ferritin studies is recommended to confirm a diagnosis. See Additional Information and Comments. Additional Clinical Information: Hereditary hemochromatosis (HFE related) is an autosomal recessive iron storage disorder. Patients may have a genetic diagnosis of hereditary hemochromatosis and never show clinical symptoms. Clinical symptoms typically appear between 40 to 60 years in males and after menopause in females. Signs and symptoms may include organ damage, primarily in the liver, risk for hepatocellular carcinoma, diabetes, and heart disease due to iron accumulation. Life expectancy may be decreased in  individuals who develop cirrhosis. Treatment for clinically symptomatic individuals may include th erapeutic phlebotomy. Liver transplant may be  used to treat end stage liver failure. For preventive care, monitoring for iron overload is recommended for patients who are homozygous for c.845G>A (p.Cys282Tyr) and have yet to experience clinical symptoms. Comments: The most common HFE variants associated with hereditary hemochromatosis are c.845G>A (p.Cys282Tyr), c.187C>G (p.His63Asp), c.193A>T (p.Ser65Cys). While patients homozygous for c.845G>A (p.Cys282Tyr) are the most likely to present clinical symptoms, less than 10% develop clinically significant iron overload with tissue and organ damage. Genetic counseling is recommended to discuss the potential clinical implications of positive results, as well as recommendations for testing family members. Genetic Coordinators are available for health care providers to discuss results at [phone]). Test Details: Three variants analyzed: c.845G>A (p.Cys282Tyr), commonly referred to as C282Y c.187C>G (p.His63Asp) , commonly referred to as H63D c.193A>T (p.Ser65Cys), commonly referred to as S65C Methods/Limitations: DNA Analysis of the HFE gene (NM_[id number]) was performed by PCR amplification followed by restriction enzyme digestion analyses. Results must be combined with clinical information for the most accurate interpretation. Molecular-based testing is highly accurate, but as in any laboratory test, diagnostic errors may occur. False positive or false negative results may occur for reasons that include genetic variants, blood transfusions, bone marrow transplantation, somatic or tissue-specific mosaicism, mislabeled samples, or erroneous representation of family relationships. This test was developed and its performance characteristics determined by [facility]. It has not been cleared or approved by the Food and Drug Administration. References: Aldona CAVES, [address], Kowdley SONNA Monte LW, [place]av[place] AS; American Association for the Study of Liver Diseases. Diagnosis  and management o f hemochromatosis: 2011 practice guideline by the American Association for the Study of Liver Diseases. Hepatology. 2011 Jul;54(1):328-43. doi: 10.1002/hep.24330. PMID: 78547709; PMCID: EFR6850874. [address] Brissot P, Swinkels DW, Zoller H, Kamarainen O, Patton S, Alonso I, Morris M, Keeney S. EMQN best practice guidelines for the molecular genetic diagnosis of hereditary hemochromatosis [facility]). Eur J Hum Genet. 2016 Apr;24(4):479-95. doi: 10.1038/ejhg.2015.128. Epub 2015 Jul 8. PMID: 73846781; PMCID: EFR5070138.    Reviewed by: Comment     Comment: (NOTE) Technical Component performed at [employer] [facility] Professional Component performed by: W. Wanda Norse, PhD, [facility], [facility], [address] [place] [address] Performed At: [facility] [address] [place], [place] [id number] Loran Gales MDPhD Ey:[phone]   Iron and TIBC     Status: Abnormal   Collection Time: 07/05/24  2:24 PM  Result Value Ref Range   Iron 218 (H) 45 - 182 ug/dL   TIBC 726 749 - 549 ug/dL   Saturation Ratios 80 (H) 17.9 - 39.5 %   UIBC 55 ug/dL    Comment: Performed at [facility], [address] [place], [place] [address]  CMP (Cancer Center only)     Status: Abnormal   Collection Time: 07/05/24  2:24 PM  Result Value Ref Range   Sodium 140 135 - 145 mmol/L   Potassium 4.9 3.5 - 5.1 mmol/L   Chloride 102 98 - 111 mmol/L   CO2 29 22 - 32 mmol/L   Glucose, Bld 98 70 - 99 mg/dL    Comment: Glucose reference range applies only to samples taken after fasting for at least 8 hours.   BUN 15 6 - 20 mg/dL   Creatinine 9.15 9.38 - 1.24 mg/dL   Calcium 89.3 (H) 8.9 - 10.3 mg/dL   Total Protein 7.2 6.5 - 8.1 g/dL   Albumin 4.7 3.5 -  5.0 g/dL   AST 43 (H) 15 - 41 U/L   ALT 86 (H) 0 - 44 U/L   Alkaline Phosphatase 79 38 - 126 U/L   Total Bilirubin 1.2 0.0 - 1.2 mg/dL   GFR, Estimated >39 >39 mL/min    Comment: (NOTE) Calculated using the CKD-EPI Creatinine Equation  (2021)    Anion gap 10 5 - 15    Comment: Performed at [facility], [address], [place], [place] [address]  CBC with Differential (Cancer Center Only)     Status: None   Collection Time: 07/05/24  2:24 PM  Result Value Ref Range   WBC Count 6.0 4.0 - 10.5 K/uL   RBC 4.88 4.22 - 5.81 MIL/uL   Hemoglobin 15.6 13.0 - 17.0 g/dL   HCT 55.9 60.9 - 47.9 %   MCV 90.2 80.0 - 100.0 fL   MCH 32.0 26.0 - 34.0 pg   MCHC 35.5 30.0 - 36.0 g/dL   RDW 87.4 88.4 - 84.4 %   Platelet Count 217 150 - 400 K/uL   nRBC 0.0 0.0 - 0.2 %   Neutrophils Relative % 65 %   Neutro Abs 3.9 1.7 - 7.7 K/uL   Lymphocytes Relative 23 %   Lymphs Abs 1.4 0.7 - 4.0 K/uL   Monocytes Relative 10 %   Monocytes Absolute 0.6 0.1 - 1.0 K/uL   Eosinophils Relative 1 %   Eosinophils Absolute 0.1 0.0 - 0.5 K/uL   Basophils Relative 1 %   Basophils Absolute 0.0 0.0 - 0.1 K/uL   Immature Granulocytes 0 %   Abs Immature Granulocytes 0.02 0.00 - 0.07 K/uL    Comment: Performed at [facility], [address], [place], [place] [address]  Ferritin     Status: Abnormal   Collection Time: 07/05/24  2:25 PM  Result Value Ref Range   Ferritin 1,675 (H) 24 - 336 ng/mL    Comment: Performed at [facility], [address] [place], [place], [place] [address]     RADIOGRAPHIC STUDIES:  No recent pertinent imaging studies available to review.  Orders Placed This Encounter  Procedures   MR LIVER W WO CONTRAST    Standing Status:   Future    Expected Date:   07/16/2024    Expiration Date:   07/13/2025    If indicated for the ordered procedure, I authorize the administration of contrast media per Radiology protocol:   Yes    What is the patient's sedation requirement?:   No Sedation    Does the patient have a pacemaker or implanted devices?:   No    Preferred imaging location?:   [facility] (table limit 500lbs)   Hemoglobin and Hematocrit (Cancer Center Only)     Standing Status:   Standing    Number of Occurrences:   10    Expiration Date:   07/13/2025   Ferritin    Standing Status:   Standing    Number of Occurrences:   10    Expiration Date:   07/13/2025   ECHOCARDIOGRAM COMPLETE    Standing Status:   Future    Expected Date:   07/20/2024    Expiration Date:   07/13/2025    Where should this test be performed:   [facility]    Perflutren DEFINITY (image enhancing agent) should be administered unless hypersensitivity or allergy exist:   Administer Perflutren    Reason for exam-Echo:   Other-Full Diagnosis List    Full ICD-10/Reason  for Exam:   Hemochromatosis [197509]    Other Comments:   Patient with newly diagnosed high risk for hemochromatosis.  Please evaluate for any cardiac involvement from iron overload.     Future Appointments  Date Time Provider Department Center  07/20/2024  1:30 PM [facility] PHLEBOTOMIST [facility] None  07/20/2024  2:00 PM [facility] INFUSION [facility] None    This document was completed utilizing speech recognition software. Grammatical errors, random word insertions, pronoun errors, and incomplete sentences are an occasional consequence of this system due to software limitations, ambient noise, and hardware issues. Any formal questions or concerns about the content, text or information contained within the body of this dictation should be directly addressed to the provider for clarification.  "

## 2024-07-20 ENCOUNTER — Inpatient Hospital Stay

## 2024-07-20 DIAGNOSIS — R7989 Other specified abnormal findings of blood chemistry: Secondary | ICD-10-CM | POA: Diagnosis not present

## 2024-07-20 LAB — FERRITIN: Ferritin: 1349 ng/mL — ABNORMAL HIGH (ref 24–336)

## 2024-07-20 LAB — HEMOGLOBIN AND HEMATOCRIT (CANCER CENTER ONLY)
HCT: 44.5 % (ref 39.0–52.0)
Hemoglobin: 15.4 g/dL (ref 13.0–17.0)

## 2024-07-20 NOTE — Patient Instructions (Signed)

## 2024-07-20 NOTE — Progress Notes (Signed)
 Kunio Cummiskey presents today for phlebotomy per MD orders. Phlebotomy procedure started at 1346 and ended at 1351. 514 grams removed using a standard phlebotomy kit. VSS Patient observed for 30 minutes after procedure without any incident. Patient tolerated procedure well. IV needle removed intact.

## 2024-07-23 ENCOUNTER — Ambulatory Visit (HOSPITAL_BASED_OUTPATIENT_CLINIC_OR_DEPARTMENT_OTHER)
Admission: RE | Admit: 2024-07-23 | Discharge: 2024-07-23 | Disposition: A | Discharge status: Home / Self Care | Source: Ambulatory Visit | Attending: Oncology | Admitting: Oncology

## 2024-07-23 DIAGNOSIS — R748 Abnormal levels of other serum enzymes: Secondary | ICD-10-CM | POA: Insufficient documentation

## 2024-07-23 MED ORDER — GADOBUTROL 1 MMOL/ML IV SOLN
9.8000 mL | Freq: Once | INTRAVENOUS | Status: AC | PRN
  Administered 2024-07-23: 9.8 mL via INTRAVENOUS
  Filled 2024-07-23: qty 10

## 2024-08-02 ENCOUNTER — Inpatient Hospital Stay: Attending: Oncology

## 2024-08-02 ENCOUNTER — Inpatient Hospital Stay

## 2024-08-02 DIAGNOSIS — R7989 Other specified abnormal findings of blood chemistry: Secondary | ICD-10-CM | POA: Insufficient documentation

## 2024-08-02 DIAGNOSIS — G4730 Sleep apnea, unspecified: Secondary | ICD-10-CM | POA: Insufficient documentation

## 2024-08-02 LAB — FERRITIN: Ferritin: 1960 ng/mL — ABNORMAL HIGH (ref 24–336)

## 2024-08-02 LAB — HEMOGLOBIN AND HEMATOCRIT (CANCER CENTER ONLY)
HCT: 50.9 % (ref 39.0–52.0)
Hemoglobin: 17.3 g/dL — ABNORMAL HIGH (ref 13.0–17.0)

## 2024-08-02 NOTE — Patient Instructions (Signed)

## 2024-08-02 NOTE — Progress Notes (Signed)
 Brendan Brady presents today for phlebotomy per MD orders. Phlebotomy procedure started at 1426 and ended at 1432. 526 grams removed. Used standard 16 G phlebotomy kit. Patient observed for 30 minutes after procedure without any incident. Pt denied drink or snack. VSS. Patient tolerated procedure well. IV needle removed intact.

## 2024-08-03 ENCOUNTER — Encounter: Payer: Self-pay | Admitting: Oncology

## 2024-08-17 ENCOUNTER — Inpatient Hospital Stay

## 2024-08-17 ENCOUNTER — Inpatient Hospital Stay: Admitting: Oncology

## 2024-08-17 ENCOUNTER — Encounter: Payer: Self-pay | Admitting: Oncology

## 2024-08-17 DIAGNOSIS — R748 Abnormal levels of other serum enzymes: Secondary | ICD-10-CM

## 2024-08-17 LAB — CBC WITH DIFFERENTIAL (CANCER CENTER ONLY)
Abs Immature Granulocytes: 0.03 10*3/uL (ref 0.00–0.07)
Basophils Absolute: 0 10*3/uL (ref 0.0–0.1)
Basophils Relative: 0 %
Eosinophils Absolute: 0.2 10*3/uL (ref 0.0–0.5)
Eosinophils Relative: 3 %
HCT: 53.6 % — ABNORMAL HIGH (ref 39.0–52.0)
Hemoglobin: 18.5 g/dL — ABNORMAL HIGH (ref 13.0–17.0)
Immature Granulocytes: 0 %
Lymphocytes Relative: 30 %
Lymphs Abs: 2.1 10*3/uL (ref 0.7–4.0)
MCH: 33.6 pg (ref 26.0–34.0)
MCHC: 34.5 g/dL (ref 30.0–36.0)
MCV: 97.3 fL (ref 80.0–100.0)
Monocytes Absolute: 1 10*3/uL (ref 0.1–1.0)
Monocytes Relative: 14 %
Neutro Abs: 3.9 10*3/uL (ref 1.7–7.7)
Neutrophils Relative %: 53 %
Platelet Count: 190 10*3/uL (ref 150–400)
RBC: 5.51 MIL/uL (ref 4.22–5.81)
RDW: 12.6 % (ref 11.5–15.5)
WBC Count: 7.2 10*3/uL (ref 4.0–10.5)
nRBC: 0 % (ref 0.0–0.2)

## 2024-08-17 LAB — CMP (CANCER CENTER ONLY)
ALT: 55 U/L — ABNORMAL HIGH (ref 0–44)
AST: 38 U/L (ref 15–41)
Albumin: 4.4 g/dL (ref 3.5–5.0)
Alkaline Phosphatase: 78 U/L (ref 38–126)
Anion gap: 14 (ref 5–15)
BUN: 14 mg/dL (ref 6–20)
CO2: 25 mmol/L (ref 22–32)
Calcium: 10.4 mg/dL — ABNORMAL HIGH (ref 8.9–10.3)
Chloride: 103 mmol/L (ref 98–111)
Creatinine: 0.8 mg/dL (ref 0.61–1.24)
GFR, Estimated: >60 mL/min
Glucose, Bld: 106 mg/dL — ABNORMAL HIGH (ref 70–99)
Potassium: 4.4 mmol/L (ref 3.5–5.1)
Sodium: 141 mmol/L (ref 135–145)
Total Bilirubin: 0.6 mg/dL (ref 0.0–1.2)
Total Protein: 7.1 g/dL (ref 6.5–8.1)

## 2024-08-17 LAB — IRON AND TIBC
Iron: 144 ug/dL (ref 45–182)
Saturation Ratios: 56 % — ABNORMAL HIGH (ref 17.9–39.5)
TIBC: 256 ug/dL (ref 250–450)
UIBC: 112 ug/dL

## 2024-08-17 LAB — FERRITIN: Ferritin: 1136 ng/mL — ABNORMAL HIGH (ref 24–336)

## 2024-08-17 NOTE — Assessment & Plan Note (Addendum)
 On 05/06/2024, labs showed increased iron saturation of 93%, ferritin increased at 1456 ng/mL.  Hemoglobin was 19.5, hematocrit 57.2.  White count and platelet count were within normal limits.  CMP unremarkable.  Testosterone level was increased at 924, presumed to be from testosterone supplementation.  Repeat labs on 06/09/2024 showed iron saturation of 94%, ferritin increased at 1679 ng/mL.  Hemoglobin was 17.4, hematocrit 52.2.  White count and platelet count were within normal limits.  Given elevated ferritin and iron saturation, with concern for hemochromatosis, referral was sent to us  for further evaluation.  On his consultation with us  on 07/05/2024, labs showed unremarkable CBCD.  On CMP, ALT was increased at 86, AST slightly increased at 43, alkaline phosphatase and bilirubin were within normal limits.  Otherwise unremarkable CMP.  Iron studies showed increased iron saturation of 80%.  Ferritin was increased at 1675 ng/mL.  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  There was no evidence of H63D or S65C mutations.  We arranged therapeutic phlebotomies with aim to keep ferritin closer to 50 and below and iron saturation below 50%, with frequency adjusted based on hemoglobin and tolerance (initially every other week, then monthly, then every three months for maintenance).  He started this from 07/20/2024.  Request submitted for MRI of the liver and echocardiogram to look for any evidence of endorgan damage.  Echocardiogram is currently scheduled on 08/31/2024.  On 07/23/2024, MRI of the liver showed hepatic signal changes compatible with iron deposition as seen with given diagnosis of hemochromatosis. No evidence of cirrhosis or suspicious hepatic lesion.  - Discussed iron chelation therapy as alternative if phlebotomy is insufficient or not tolerated, including potential side effects.  - Provided dietary guidance: moderate iron intake from cast iron cookware and avoid  raw seafood/shellfish due to increased risk of Vibrio vulnificus infection.  - Discussed continued testosterone replacement therapy, noting no contraindication but need for monitoring PSA and red cell count.  Labs today showed ferritin of 1136, improved compared to prior but still well above his target range.  Will continue with therapeutic phlebotomies every 2 weeks.  Proceeded with therapeutic phlebotomy today.  We will review echocardiogram results when available.  Plan to see him approximately in 8 weeks.

## 2024-08-17 NOTE — Assessment & Plan Note (Addendum)
 Mildly elevated ALT and AST, with increasing ALT trend.  Chronic mild elevation in liver enzymes attributed to hepatic iron deposition as demonstrated on recent MRI. No evidence of cirrhosis or hepatic lesions. No symptoms of hepatic decompensation. Ongoing surveillance is indicated to monitor for liver complications.  LFTs are better today with ALT of 55, rest of the LFTs normal.  - Planned to check alpha-fetoprotein (AFP) tumor marker at next visit for baseline and surveillance.  We will plan to obtain liver ultrasound annually going forward.

## 2024-08-17 NOTE — Progress Notes (Signed)
 Brendan Brady presents today for phlebotomy per MD orders. Phlebotomy procedure started at 1400 and ended at 1406. 515 grams removed. 16G phlebotomy kit used to R AC. Patient observed for 15 minutes after procedure without any incident. Patient tolerated procedure well. IV needle removed intact.

## 2024-08-17 NOTE — Progress Notes (Signed)
 "  [facility]  HEMATOLOGY CLINIC PROGRESS NOTE  PATIENT NAME: Brendan Brady   MR#: [id number] DOB: June 25, 1981  Patient Care Team: Brendan Brady as PCP - General (General Practice)  Date of visit: 08/17/2024   ASSESSMENT & PLAN:   Brendan Brady is a 44 y.o. gentleman with a past medical history of hypothyroidism, hypogonadism, was referred to our service in December 2025 for evaluation of elevated ferritin and iron saturation, concerning for hemochromatosis.  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  Hereditary hemochromatosis On 05/06/2024, labs showed increased iron saturation of 93%, ferritin increased at 1456 ng/mL.  Hemoglobin was 19.5, hematocrit 57.2.  White count and platelet count were within normal limits.  CMP unremarkable.  Testosterone level was increased at 924, presumed to be from testosterone supplementation.  Repeat labs on 06/09/2024 showed iron saturation of 94%, ferritin increased at 1679 ng/mL.  Hemoglobin was 17.4, hematocrit 52.2.  White count and platelet count were within normal limits.  Given elevated ferritin and iron saturation, with concern for hemochromatosis, referral was sent to us  for further evaluation.  On his consultation with us  on 07/05/2024, labs showed unremarkable CBCD.  On CMP, ALT was increased at 86, AST slightly increased at 43, alkaline phosphatase and bilirubin were within normal limits.  Otherwise unremarkable CMP.  Iron studies showed increased iron saturation of 80%.  Ferritin was increased at 1675 ng/mL.  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  There was no evidence of H63D or S65C mutations.  We arranged therapeutic phlebotomies with aim to keep ferritin closer to 50 and below and iron saturation below 50%, with frequency adjusted based on hemoglobin and tolerance (initially every other week, then monthly, then every three months for maintenance).  He started this from  07/20/2024.  Request submitted for MRI of the liver and echocardiogram to look for any evidence of endorgan damage.  Echocardiogram is currently scheduled on 08/31/2024.  On 07/23/2024, MRI of the liver showed hepatic signal changes compatible with iron deposition as seen with given diagnosis of hemochromatosis. No evidence of cirrhosis or suspicious hepatic lesion.  - Discussed iron chelation therapy as alternative if phlebotomy is insufficient or not tolerated, including potential side effects.  - Provided dietary guidance: moderate iron intake from cast iron cookware and avoid raw seafood/shellfish due to increased risk of Vibrio vulnificus infection.  - Discussed continued testosterone replacement therapy, noting no contraindication but need for monitoring PSA and red cell count.  Labs today showed ferritin of 1136, improved compared to prior but still well above his target range.  Will continue with therapeutic phlebotomies every 2 weeks.  Proceeded with therapeutic phlebotomy today.  We will review echocardiogram results when available.  Plan to see him approximately in 8 weeks.  Abnormal liver enzymes Mildly elevated ALT and AST, with increasing ALT trend.  Chronic mild elevation in liver enzymes attributed to hepatic iron deposition as demonstrated on recent MRI. No evidence of cirrhosis or hepatic lesions. No symptoms of hepatic decompensation. Ongoing surveillance is indicated to monitor for liver complications.  LFTs are better today with ALT of 55, rest of the LFTs normal.  - Planned to check alpha-fetoprotein (AFP) tumor marker at next visit for baseline and surveillance.  We will plan to obtain liver ultrasound annually going forward.   I spent a total of 35 minutes during this encounter with the patient including review of chart and various tests results, discussions about plan of care and coordination of care  plan.  I reviewed lab results and outside records for this  visit and discussed relevant results with the patient. Diagnosis, plan of care and treatment options were also discussed in detail with the patient. Opportunity provided to ask questions and answers provided to his apparent satisfaction. Provided instructions to call our clinic with any problems, questions or concerns prior to return visit. I recommended to continue follow-up with PCP and sub-specialists. He verbalized understanding and agreed with the plan. No barriers to learning was detected.  Chinita Patten, MD  08/17/2024 4:55 PM  [facility] A DEPT OF 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  Iron deposition noted in the liver on MRI.  INTERVAL HISTORY:  Discussed the use of AI scribe software for clinical note transcription with the patient, who gave verbal consent to proceed.  History of Present Illness Brendan Brady is a 44 year old male with hereditary hemochromatosis who presents for hematology follow-up and ongoing phlebotomy management for iron overload.  He has persistently elevated ferritin levels, with the most recent value two weeks ago at 1900, the highest recorded. Ferritin has fluctuated, and he is currently undergoing phlebotomy every two weeks. He reports mild viral symptoms over the past few weeks, including a gastrointestinal illness two weeks ago and ongoing upper respiratory congestion, which may have contributed to laboratory variability. He denies nausea or vomiting. He reports difficulty maintaining adequate hydration, stating he has a hard time drinking water, which may impact his blood counts.  Recent liver MRI demonstrated iron deposition confined to the liver without evidence of cirrhosis or focal hepatic lesions. No iron  deposition was observed in the spleen. Liver enzymes have been intermittently elevated; results from today's testing are pending. Blood counts have fluctuated, with current labs showing normal leukocyte and platelet counts, but elevated hemoglobin at 18.5 and hematocrit at 53.  He is on weekly testosterone replacement therapy (1 mL injection), which he previously discontinued due to concerns about hepatic function but has since resumed, reporting improved energy. He is attempting dietary modifications, though he finds adherence challenging at work, particularly at [employer].    SUMMARY OF HEMATOLOGY HISTORY:   On 05/06/2024, labs showed increased iron saturation of 93%, ferritin increased at 1456 ng/mL.  Hemoglobin was 19.5, hematocrit 57.2.  White count and platelet count were within normal limits.  CMP unremarkable.  Testosterone level was increased at 924, presumed to be from testosterone supplementation.   Repeat labs on 06/09/2024 showed iron saturation of 94%, ferritin increased at 1679 ng/mL.  Hemoglobin was 17.4, hematocrit 52.2.  White count and platelet count were within normal limits.   Given elevated ferritin and iron saturation, with concern for hemochromatosis, referral was sent to us  for further evaluation.   He has had persistently elevated ferritin levels for approximately two years, initially identified during routine laboratory testing. Recent ferritin values were 1,667 and 779 ng/mL, with iron saturation reaching 94%. He has undergone phlebotomy every two to three weeks for three sessions, resulting in a downward trend in ferritin, but hyperferritinemia persists. He expresses significant anxiety regarding potential organ damage from iron overload and is motivated to manage his condition.   Liver enzymes have been intermittently elevated over several years, with ALT as high as 68 U/L and occasional mild hyperbilirubinemia. He notes that weight loss and  dietary modifications  have helped normalize his liver enzymes, but minor dietary lapses can cause elevations.   He has been on testosterone replacement therapy for two years to maintain normal testosterone levels and improve sleep. He notes fluctuations in red blood cell count and testosterone levels, and is aware of the need for ongoing PSA monitoring. He previously used CPAP for sleep apnea, which he discontinued after weight loss with improved sleep quality.   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 He denies consumption of shellfish or raw seafood and expresses concern about infection risk and dietary management in the context of suspected hemochromatosis.   He is employed as a landscape architect. He recently underwent a coronary calcium score, which was reportedly normal. He is anxious about his abnormal laboratory results and the possibility of hemochromatosis.   On his consultation with us  on 07/05/2024, labs showed unremarkable CBCD.  On CMP, ALT was increased at 86, AST slightly increased at 43, alkaline phosphatase and bilirubin were within normal limits.  Otherwise unremarkable CMP.  Iron studies showed increased iron saturation of 80%.  Ferritin was increased at 1675 ng/mL.   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  There was no evidence of H63D or S65C mutations.   We arranged therapeutic phlebotomies with aim to keep ferritin closer to 50 and below and iron saturation below 50%, with frequency adjusted based on hemoglobin and tolerance (initially every other week, then monthly, then every three months for maintenance).     Request submitted for MRI of the liver and echocardiogram to look for any evidence of endorgan damage.   He was started on therapeutic phlebotomy program from 07/20/2024, initially every 2 weeks.   -  Discussed iron chelation therapy as alternative if phlebotomy is insufficient or not tolerated, including potential side effects.   - Provided dietary guidance: moderate iron intake from cast iron cookware and avoid raw seafood/shellfish due to increased risk of Vibrio vulnificus infection.  On 07/23/2024, MRI of the liver showed hepatic signal changes compatible with iron deposition as seen with given diagnosis of hemochromatosis. No evidence of cirrhosis or suspicious hepatic lesion.  I have reviewed the past medical history, past surgical history, social history and family history with the patient and they are unchanged from previous note.  ALLERGIES: He is allergic to latex.  MEDICATIONS:  Current Outpatient Medications  Medication Sig Dispense Refill   ibuprofen (ADVIL,MOTRIN) 200 MG tablet Take 200 mg by mouth every 6 (six) hours as needed for mild pain.     levothyroxine (SYNTHROID) 137 MCG tablet Take 137 mcg by mouth daily before breakfast.     No current facility-administered medications for this visit.     REVIEW OF SYSTEMS:    Review of Systems - Oncology  All other pertinent systems were reviewed with the patient and are negative.  PHYSICAL EXAMINATION:   Onc Performance Status - 08/17/24 1329       ECOG Perf Status   ECOG Perf Status Fully active, able to carry on all pre-disease performance without restriction      KPS SCALE   KPS % SCORE Normal, no compliants, no evidence of disease          Vitals:   08/17/24 1322  BP: (!) 157/81  Pulse: 93  Resp: 18  Temp: 99.4 F (37.4 C)  SpO2: 97%   Filed Weights   08/17/24 1322  Weight: 231 lb (104.8 kg)    Physical Exam Constitutional:      General: He is not in acute distress.    Appearance: Normal appearance.  HENT:     Head: Normocephalic and atraumatic.  Eyes:     Conjunctiva/sclera: Conjunctivae normal.  Cardiovascular:     Rate and Rhythm: Normal rate and regular rhythm.  Pulmonary:      Effort: Pulmonary effort is normal. No respiratory distress.  Abdominal:     General: There is no distension.  Neurological:     General: No focal deficit present.     Mental Status: He is alert and oriented to person, place, and time.  Psychiatric:        Mood and Affect: Mood normal.        Behavior: Behavior normal.    LABORATORY DATA:   I have reviewed the data as listed.  Results for orders placed or performed in visit on 08/17/24  Ferritin  Result Value Ref Range   Ferritin 1,136 (H) 24 - 336 ng/mL  CBC with Differential (Cancer Center Only)  Result Value Ref Range   WBC Count 7.2 4.0 - 10.5 K/uL   RBC 5.51 4.22 - 5.81 MIL/uL   Hemoglobin 18.5 (H) 13.0 - 17.0 g/dL   HCT 46.3 (H) 60.9 - 47.9 %   MCV 97.3 80.0 - 100.0 fL   MCH 33.6 26.0 - 34.0 pg   MCHC 34.5 30.0 - 36.0 g/dL   RDW 87.3 88.4 - 84.4 %   Platelet Count 190 150 - 400 K/uL   nRBC 0.0 0.0 - 0.2 %   Neutrophils Relative % 53 %   Neutro Abs 3.9 1.7 - 7.7 K/uL   Lymphocytes Relative 30 %   Lymphs Abs 2.1 0.7 - 4.0 K/uL   Monocytes Relative 14 %   Monocytes Absolute 1.0 0.1 - 1.0 K/uL   Eosinophils Relative 3 %   Eosinophils Absolute 0.2 0.0 - 0.5 K/uL   Basophils Relative 0 %   Basophils Absolute 0.0 0.0 - 0.1 K/uL   Immature Granulocytes 0 %   Abs Immature Granulocytes 0.03 0.00 - 0.07 K/uL  CMP (Cancer Center only)  Result Value Ref Range   Sodium 141 135 - 145 mmol/L   Potassium 4.4 3.5 - 5.1 mmol/L   Chloride 103 98 - 111 mmol/L   CO2 25 22 - 32 mmol/L   Glucose, Bld 106 (H) 70 - 99 mg/dL   BUN 14 6 - 20 mg/dL   Creatinine 9.19 9.38 - 1.24 mg/dL   Calcium 89.5 (H) 8.9 - 10.3 mg/dL   Total Protein 7.1 6.5 - 8.1 g/dL   Albumin 4.4 3.5 - 5.0 g/dL   AST 38 15 - 41 U/L   ALT 55 (H) 0 - 44 U/L   Alkaline Phosphatase 78 38 - 126 U/L   Total Bilirubin 0.6 0.0 - 1.2 mg/dL   GFR, Estimated >39 >39 mL/min   Anion gap 14 5 - 15    RADIOGRAPHIC STUDIES:  I have personally reviewed the radiological  images as listed and agree with the findings in the report.  MR LIVER W WO CONTRAST CLINICAL DATA:  Hemochromatosis, elevated LFTs.  EXAM: MRI ABDOMEN WITHOUT AND WITH CONTRAST  TECHNIQUE: Multiplanar multisequence MR imaging of the abdomen was performed both before and after the administration of intravenous contrast.  CONTRAST:  9.8mL GADAVIST  GADOBUTROL  1 MMOL/ML IV SOLN  COMPARISON:  None Available.  FINDINGS: Lower chest: No acute abnormality.  Hepatobiliary: Loss of signal on inphase imaging and decreased intrinsic T1 and T2 signal throughout the hepatic parenchyma compatible with iron deposition as seen with given diagnosis of hemochromatosis. No hepatic contour nodularity widening of the fissures or enlargement of the lateral left lobe of the liver to suggest cirrhosis. No suspicious hepatic lesion.  Gallbladder is unremarkable.  No biliary ductal dilation.  Pancreas: No pancreatic ductal dilation or evidence of acute inflammation.  Spleen:  No splenic iron deposition.  No splenomegaly.  Adrenals/Urinary Tract: No suspicious adrenal nodule/mass. No hydronephrosis. Left cortical renal scarring. Bilateral renal cysts some of which are hemorrhagic/proteinaceous.  Stomach/Bowel: No evidence of bowel obstruction.  Vascular/Lymphatic: Normal caliber abdominal aorta. Smooth IVC contours. The portal, splenic and superior mesenteric veins are patent.  Other:  No significant abdominal free fluid.  Musculoskeletal: No acute osseous abnormality.  IMPRESSION: Hepatic signal changes compatible with iron deposition as seen with given diagnosis of hemochromatosis. No evidence of cirrhosis or suspicious hepatic lesion.  Electronically Signed   By: Reyes Holder M.D.   On: 07/27/2024 09:56   Orders Placed This Encounter  Procedures   AFP tumor marker    Standing Status:   Future    Expected Date:   10/12/2024    Expiration Date:   01/10/2025   CBC with Differential  (Cancer Center Only)    Standing Status:   Future    Expected Date:   10/12/2024    Expiration Date:   01/10/2025   CMP (Cancer Center only)    Standing Status:   Future    Expected Date:   10/12/2024    Expiration Date:   01/10/2025   Iron and TIBC    Standing Status:   Future    Expected Date:   10/12/2024    Expiration Date:   01/10/2025   Ferritin    Standing Status:   Future    Expected Date:   10/12/2024    Expiration Date:   01/10/2025     Future Appointments  Date Time Provider Department Center  08/31/2024 12:30 PM [facility] [id number]  08/31/2024  1:30 PM [facility] PHLEBOTOMIST [facility] None  08/31/2024  2:00 PM [facility] INFUSION [facility] None  09/13/2024  1:30 PM [facility] PHLEBOTOMIST [facility] None  09/13/2024  2:00 PM [facility] INFUSION [facility] None  09/24/2024  1:30 PM [facility] PHLEBOTOMIST [facility] None  09/24/2024  2:00 PM [facility] INFUSION [facility] None  10/11/2024 11:30 AM [facility] PHLEBOTOMIST [facility] None  10/11/2024 11:45 AM Flo Berroa, MD [facility] None  10/11/2024 12:15 PM [facility] INFUSION [facility] None     This document was completed utilizing speech recognition software. Grammatical errors, random word insertions, pronoun errors, and incomplete sentences are an occasional consequence of this system due to software limitations, ambient noise, and hardware issues. Any formal questions or concerns about the content, text or information contained within the body of this dictation should be directly addressed to the provider for clarification.  "

## 2024-08-31 ENCOUNTER — Other Ambulatory Visit (HOSPITAL_BASED_OUTPATIENT_CLINIC_OR_DEPARTMENT_OTHER)

## 2024-08-31 ENCOUNTER — Inpatient Hospital Stay

## 2024-09-13 ENCOUNTER — Inpatient Hospital Stay

## 2024-09-24 ENCOUNTER — Inpatient Hospital Stay

## 2024-10-11 ENCOUNTER — Inpatient Hospital Stay: Admitting: Oncology

## 2024-10-11 ENCOUNTER — Inpatient Hospital Stay
